# Patient Record
Sex: Female | Born: 1975 | Race: White | Hispanic: No | Marital: Single | Smoking: Never smoker
Health system: Southern US, Community
[De-identification: ages and names within clinical notes are randomized; demographics above are authoritative.]

## PROBLEM LIST (undated history)

## (undated) DIAGNOSIS — R519 Headache, unspecified: Secondary | ICD-10-CM

## (undated) DIAGNOSIS — R51 Headache: Secondary | ICD-10-CM

## (undated) DIAGNOSIS — M1990 Unspecified osteoarthritis, unspecified site: Secondary | ICD-10-CM

## (undated) DIAGNOSIS — J189 Pneumonia, unspecified organism: Secondary | ICD-10-CM

## (undated) DIAGNOSIS — Z8742 Personal history of other diseases of the female genital tract: Secondary | ICD-10-CM

## (undated) HISTORY — DX: Personal history of other diseases of the female genital tract: Z87.42

## (undated) HISTORY — PX: ABDOMINAL HYSTERECTOMY: SHX81

## (undated) HISTORY — PX: CHOLECYSTECTOMY: SHX55

---

## 2000-12-02 ENCOUNTER — Other Ambulatory Visit: Admission: RE | Admit: 2000-12-02 | Discharge: 2000-12-02 | Payer: Self-pay | Admitting: *Deleted

## 2001-02-20 ENCOUNTER — Encounter: Payer: Self-pay | Admitting: *Deleted

## 2001-02-20 ENCOUNTER — Ambulatory Visit (HOSPITAL_COMMUNITY): Admission: RE | Admit: 2001-02-20 | Discharge: 2001-02-20 | Payer: Self-pay | Admitting: *Deleted

## 2001-05-04 ENCOUNTER — Ambulatory Visit (HOSPITAL_COMMUNITY): Admission: RE | Admit: 2001-05-04 | Discharge: 2001-05-04 | Payer: Self-pay | Admitting: *Deleted

## 2001-05-04 ENCOUNTER — Encounter: Payer: Self-pay | Admitting: *Deleted

## 2001-06-27 ENCOUNTER — Inpatient Hospital Stay (HOSPITAL_COMMUNITY): Admission: RE | Admit: 2001-06-27 | Discharge: 2001-06-29 | Payer: Self-pay | Admitting: *Deleted

## 2001-11-06 ENCOUNTER — Inpatient Hospital Stay (HOSPITAL_COMMUNITY): Admission: AD | Admit: 2001-11-06 | Discharge: 2001-11-07 | Payer: Self-pay | Admitting: *Deleted

## 2001-12-21 ENCOUNTER — Encounter: Payer: Self-pay | Admitting: *Deleted

## 2001-12-21 ENCOUNTER — Ambulatory Visit (HOSPITAL_COMMUNITY): Admission: RE | Admit: 2001-12-21 | Discharge: 2001-12-21 | Payer: Self-pay | Admitting: *Deleted

## 2001-12-28 ENCOUNTER — Inpatient Hospital Stay (HOSPITAL_COMMUNITY): Admission: AD | Admit: 2001-12-28 | Discharge: 2001-12-30 | Payer: Self-pay | Admitting: *Deleted

## 2004-05-16 ENCOUNTER — Ambulatory Visit (HOSPITAL_COMMUNITY): Admission: RE | Admit: 2004-05-16 | Discharge: 2004-05-16 | Payer: Self-pay | Admitting: *Deleted

## 2005-02-11 ENCOUNTER — Emergency Department (HOSPITAL_COMMUNITY): Admission: EM | Admit: 2005-02-11 | Discharge: 2005-02-11 | Payer: Self-pay | Admitting: Emergency Medicine

## 2005-12-27 ENCOUNTER — Other Ambulatory Visit: Admission: RE | Admit: 2005-12-27 | Discharge: 2005-12-27 | Payer: Self-pay | Admitting: Obstetrics & Gynecology

## 2006-07-01 DIAGNOSIS — Z8742 Personal history of other diseases of the female genital tract: Secondary | ICD-10-CM

## 2006-07-01 HISTORY — DX: Personal history of other diseases of the female genital tract: Z87.42

## 2006-08-26 ENCOUNTER — Ambulatory Visit: Payer: Self-pay | Admitting: Gastroenterology

## 2006-10-13 ENCOUNTER — Ambulatory Visit (HOSPITAL_COMMUNITY): Admission: RE | Admit: 2006-10-13 | Discharge: 2006-10-13 | Payer: Self-pay | Admitting: Family Medicine

## 2006-11-05 ENCOUNTER — Emergency Department (HOSPITAL_COMMUNITY): Admission: EM | Admit: 2006-11-05 | Discharge: 2006-11-05 | Payer: Self-pay | Admitting: Emergency Medicine

## 2006-12-29 ENCOUNTER — Other Ambulatory Visit: Admission: RE | Admit: 2006-12-29 | Discharge: 2006-12-29 | Payer: Self-pay | Admitting: Obstetrics and Gynecology

## 2006-12-30 HISTORY — PX: COLPOSCOPY: SHX161

## 2007-07-30 ENCOUNTER — Other Ambulatory Visit: Admission: RE | Admit: 2007-07-30 | Discharge: 2007-07-30 | Payer: Self-pay | Admitting: Obstetrics and Gynecology

## 2007-09-23 ENCOUNTER — Emergency Department (HOSPITAL_COMMUNITY): Admission: EM | Admit: 2007-09-23 | Discharge: 2007-09-23 | Payer: Self-pay | Admitting: Emergency Medicine

## 2007-09-27 ENCOUNTER — Inpatient Hospital Stay (HOSPITAL_COMMUNITY): Admission: EM | Admit: 2007-09-27 | Discharge: 2007-09-30 | Payer: Self-pay | Admitting: Emergency Medicine

## 2007-09-29 ENCOUNTER — Ambulatory Visit: Payer: Self-pay | Admitting: Internal Medicine

## 2007-10-19 ENCOUNTER — Ambulatory Visit: Payer: Self-pay | Admitting: Gastroenterology

## 2007-10-19 ENCOUNTER — Encounter: Payer: Self-pay | Admitting: Gastroenterology

## 2007-10-19 ENCOUNTER — Ambulatory Visit (HOSPITAL_COMMUNITY): Admission: RE | Admit: 2007-10-19 | Discharge: 2007-10-19 | Payer: Self-pay | Admitting: Gastroenterology

## 2007-10-19 HISTORY — PX: OTHER SURGICAL HISTORY: SHX169

## 2008-04-04 ENCOUNTER — Other Ambulatory Visit: Admission: RE | Admit: 2008-04-04 | Discharge: 2008-04-04 | Payer: Self-pay | Admitting: Obstetrics and Gynecology

## 2008-10-27 ENCOUNTER — Other Ambulatory Visit: Admission: RE | Admit: 2008-10-27 | Discharge: 2008-10-27 | Payer: Self-pay | Admitting: Obstetrics & Gynecology

## 2009-02-23 ENCOUNTER — Ambulatory Visit (HOSPITAL_COMMUNITY): Admission: RE | Admit: 2009-02-23 | Discharge: 2009-02-23 | Payer: Self-pay | Admitting: Obstetrics & Gynecology

## 2009-05-22 ENCOUNTER — Inpatient Hospital Stay (HOSPITAL_COMMUNITY): Admission: AD | Admit: 2009-05-22 | Discharge: 2009-05-28 | Payer: Self-pay | Admitting: Obstetrics and Gynecology

## 2009-05-23 ENCOUNTER — Encounter: Payer: Self-pay | Admitting: Obstetrics and Gynecology

## 2009-08-16 ENCOUNTER — Inpatient Hospital Stay (HOSPITAL_COMMUNITY): Admission: AD | Admit: 2009-08-16 | Discharge: 2009-08-16 | Payer: Self-pay | Admitting: Obstetrics and Gynecology

## 2009-08-19 ENCOUNTER — Inpatient Hospital Stay (HOSPITAL_COMMUNITY): Admission: AD | Admit: 2009-08-19 | Discharge: 2009-08-20 | Payer: Self-pay | Admitting: Obstetrics & Gynecology

## 2009-08-29 ENCOUNTER — Inpatient Hospital Stay (HOSPITAL_COMMUNITY): Admission: RE | Admit: 2009-08-29 | Discharge: 2009-08-31 | Payer: Self-pay | Admitting: Obstetrics & Gynecology

## 2009-09-29 HISTORY — PX: INTRAUTERINE DEVICE INSERTION: SHX323

## 2010-07-22 ENCOUNTER — Encounter: Payer: Self-pay | Admitting: Internal Medicine

## 2010-09-24 LAB — CBC
HCT: 30.6 % — ABNORMAL LOW (ref 36.0–46.0)
Hemoglobin: 10.4 g/dL — ABNORMAL LOW (ref 12.0–15.0)
MCHC: 33.7 g/dL (ref 30.0–36.0)
MCHC: 34 g/dL (ref 30.0–36.0)
MCV: 85.3 fL (ref 78.0–100.0)
MCV: 86.7 fL (ref 78.0–100.0)
Platelets: 277 10*3/uL (ref 150–400)
Platelets: 288 10*3/uL (ref 150–400)
RDW: 13.1 % (ref 11.5–15.5)
RDW: 13.2 % (ref 11.5–15.5)

## 2010-10-03 LAB — URINALYSIS, ROUTINE W REFLEX MICROSCOPIC
Ketones, ur: NEGATIVE mg/dL
Protein, ur: NEGATIVE mg/dL
Urobilinogen, UA: 0.2 mg/dL (ref 0.0–1.0)

## 2010-10-03 LAB — URINE CULTURE: Colony Count: 2000

## 2010-10-03 LAB — CBC
Hemoglobin: 9.9 g/dL — ABNORMAL LOW (ref 12.0–15.0)
MCHC: 34 g/dL (ref 30.0–36.0)
MCV: 92.6 fL (ref 78.0–100.0)
RBC: 3.15 MIL/uL — ABNORMAL LOW (ref 3.87–5.11)
WBC: 8.1 10*3/uL (ref 4.0–10.5)

## 2010-10-03 LAB — FETAL FIBRONECTIN: Fetal Fibronectin: NEGATIVE

## 2010-10-03 LAB — RPR: RPR Ser Ql: NONREACTIVE

## 2010-10-06 LAB — TYPE AND SCREEN: Antibody Screen: NEGATIVE

## 2010-10-06 LAB — CBC
HCT: 36.4 % (ref 36.0–46.0)
Hemoglobin: 12.4 g/dL (ref 12.0–15.0)
MCHC: 34.2 g/dL (ref 30.0–36.0)
RBC: 3.92 MIL/uL (ref 3.87–5.11)
RDW: 13.4 % (ref 11.5–15.5)
WBC: 9.1 10*3/uL (ref 4.0–10.5)

## 2010-10-06 LAB — ABO/RH: ABO/RH(D): O POS

## 2010-11-13 NOTE — Consult Note (Signed)
Amy Lee, Amy Lee               ACCOUNT NO.:  [id number]   MEDICAL RECORD NO.:  [id number]          PATIENT TYPE:  INP   LOCATION:  A223                          FACILITY:  [facility]   PHYSICIAN:  R. Roetta Sessions, M.D. DATE OF BIRTH:  03-18-1976   DATE OF CONSULTATION:  09/29/2007  DATE OF DISCHARGE:                                 CONSULTATION   PRIMARY CARE PHYSICIAN:  Social research officer, government.   REASON FOR CONSULTATION:  Diarrhea, colitis.   HISTORY OF PRESENT ILLNESS:  The patient is a 35 year old Caucasian  female who presented to the hospital after a syncopal episode.  She has  had diarrhea for about one week.  She actually was seen in the emergency  department last Wednesday for the same.  She was given Lomotil,  Phenergan and Reglan, without any results.  She states she has had the  diarrhea for over one week.  She is having 5 to 10 non-bloody stools a  day.  She complains of abdominal cramping, but really no abdominal pain.  She has had nausea but no vomiting.  Her symptoms all began about one  week ago.  She says that she was about on her fourth or fifth day of  Cipro which she was given for a urinary tract infection.  She has  actually been watching her diet with a low-fat, low-carb diet and has  lost 20 pounds over the last two to three months.  She was working on  the job out of town and did not bring her lunch.  She went to [employer] and  had a kid's hamburger.  Her stomach began to swell.  She had a lot of  grumbling within one hour of eating.  She went to bed that evening and  woke up in the middle of the night with profuse diarrhea.  She has also  developed back pain, particularly on the low right side.  It was for the  back pain, that she presented to the emergency department last  Wednesday.  She continued to have diarrhea daily.  She did not eat very  much because of the symptoms.  She finally decided to come back to the  emergency department after she had a  syncopal episode after having a  bowel movement early  Sunday morning.   She had a CT of the abdomen and pelvis which revealed descending colonic  wall thickening and peri-colonic stranding with free pelvic fluid.  She  also had a right ovarian cyst and some hypodensity of the dome of the  liver and the right hepatic lobe, which were too small to characterize.  Her hemoglobin was 12.6 on admission and most likely due to dilutional  effect, is down to 11.3.  Her white count is 3800.  Albumin 2.6.  TSH  normal.  Sedimentation rate 2.  Stool lactoferrin was negative.  C.  difficile x1 was negative.  Stool cultures pending.   She had a decrease in her stools right after admission, but yesterday  apparently had up to 5 stools again.  For this reason, we have been  asked to see the patient.   She has been seen by Dr. Kassie Mends back in February 2008.  She was  seen for abdominal bloating and alternating constipation and diarrhea.  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  She failed to follow up in May 2008.   HOME MEDICATIONS:  1. Lomotil two q.6h., which was given to her through the emergency      department.  2. Reglan through the emergency department.  3. Phenergan through the emergency department.  4. She took Cipro four to five days last week.   ALLERGIES:  STADOL, CECLOR AND FIORICET.   PAST MEDICAL/SURGICAL HISTORY:  1. Migraine headaches.  2. Recent urinary tract infection.  3. IBS.  4. Status post cholecystectomy.  5. D&C.   FAMILY HISTORY:  Grandmother had colon cancer and ovarian cancer.  No  family history of IBD.   SOCIAL HISTORY:  She works as a Advice worker for [employer].  She  lives with her boyfriend.  She has three children.  She denies tobacco  use.  She occasionally drinks alcohol socially.   REVIEW OF SYSTEMS:  See the HPI for GI.  CONSTITUTIONAL:  A 20-pound  weight loss, intentional,  with change in diet.  She is also limiting her  caffeine consumption, as recommended by Dr. Cira Servant, per her report.  CARDIOPULMONARY:  No chest pain, shortness of breath or palpitations.  GENITOURINARY:  She has an IUD in place.   PHYSICAL EXAMINATION:  VITAL SIGNS:  Temperature 98.6 degrees, pulse 63,  respirations 20, blood pressure 94/64, O2 saturation 97% on room air.  GENERAL:  A pleasant young Caucasian female, in no acute distress.  SKIN:  Warm and dry.  No jaundice.  HEENT:  Sclerae anicteric.  Oropharyngeal mucosa moist and pink.  No  lesions, erythema or exudates.  NECK:  No lymphadenopathy.  CHEST:  Lungs are clear to auscultation.  HEART:  A regular rate and rhythm.  Normal S1 and S2.  No murmurs, rubs  or gallops.  ABDOMEN:  Positive bowel sounds.  Abdomen is soft, nondistended,  nontender.  No organomegaly or masses.  No rebound or guarding.  No  abdominal bruits or herniae.  EXTREMITIES:  Lower extremities:  No edema.   LABORATORY DATA:  As mentioned above.  In addition, sodium 139,  potassium 3.6, BUN 1, creatinine 0.72, glucose 88.  Platelets 170,000.   IMPRESSION:  The patient is a 35 year old Caucasian female with acute  onset diarrhea, of now seven days' duration.  A CT shows descending  colitis.  Stool cultures are pending, although her Clostridium difficile  and lactoferrin were negative x1.  She is empirically on Flagyl and  Cipro is to be added today per orders.   DIFFERENTIAL DIAGNOSES:  Includes food-borne illness, upper tract, viral  gastroenteritis, Clostridium difficile colitis (less than likely given  the failure with Flagyl and negative Clostridium difficile, but not  excluded).  Irritable bowel disease.   RECOMMENDATIONS:  1. Continue supportive measures along with antibiotic therapy.  2. She may need a colonoscopy.  Will discuss further with Dr.      __________ and provide  further recommendations.   I would like to thank Dr. Osvaldo Shipper for  allowing [place] to take part in  the care of this patient.      Tana Coast, P.AJonathon Bellows, M.D.  Electronically Signed  LL/MEDQ  D:  09/29/2007  T:  09/29/2007  Job:  [id number]   cc:   [facility]   Osvaldo Shipper, MD

## 2010-11-13 NOTE — H&P (Signed)
Amy Lee, Amy Lee               ACCOUNT NO.:  [id number]   MEDICAL RECORD NO.:  [id number]          PATIENT TYPE:  INP   LOCATION:  A223                          FACILITY:  [facility]   PHYSICIAN:  Osvaldo Shipper, MD     DATE OF BIRTH:  11-Jun-1976   DATE OF ADMISSION:  09/27/2007  DATE OF DISCHARGE:  LH                              HISTORY & PHYSICAL   PRIMARY CARE PHYSICIAN:  Social research officer, government.   ADMISSION DIAGNOSES:  1. Acute diarrhea.  2. Back pain.  3. History of migraine headaches.   CHIEF COMPLAINT:  Diarrhea for the past 5 days.   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  The patient went to her primary doctor  who was a PA at the [facility].  The patient was told that she  may have had a UTI and was prescribed Cipro for 5 days.  So this past  Tuesday the patient had a burger from [employer] and then she started  noticing bloating and cramps in her stomach.  This was followed by  nausea but no emesis and then followed by diarrhea.  She was having 9-10  episodes of loose watery stools with no blood every day.  She went to  her doctor's office and was prescribed some antiemetics.  She went to  the ER on March 25 and was again prescribed some symptomatic treatment  with Dilaudid, Reglan and Lomotil.  The diarrhea persisted but she felt  like it was getting better yesterday.  Then sometime overnight the  patient got up to use the bathroom and then passed out.  She had some  what she describes as heart fluttering and some pressure like sensation  over her chest when she passed out which since has resolved.  She  denies, however, any lightheadedness or any symptoms suggestive of  orthostatic hypotension prior to this episode of syncope.  When she had  the  syncope she got concerned and came back to the emergency department.  There she was found to have a low blood pressure of 77 over 40s.  The  patient was given IV fluids and we were called for further evaluation.  The patient also had a fever of 100.1 during the past couple of days.  She denied any acute pain in the abdomen, just cramps.  The lower back  pain also started a few days ago, mostly in the right side.  She fell  down and she had a syncope but she fell on her stomach and denies any  injury to the back.   MEDICATIONS AT HOME:  No scheduled medications.  She was on Lomotil,  Reglan, Phenergan on an as-needed basis for the past few days.   ALLERGIES:  Include STADOL.  She is also allergic to [employer] and  FIORICET.   PAST MEDICAL HISTORY:  Positive for migraine headaches,  irritable bowel  syndrome, cholecystectomy, preterm labor, D and C.   SOCIAL HISTORY:  Lives in [place] with boyfriend.  Works as a  Advice worker.  No smoking use.  Social alcohol use.   FAMILY HISTORY:  Positive for ovarian and colon cancer in grandmother,  history of hypertension and dyslipidemia.   REVIEW OF SYSTEMS:  GENERAL:  Positive for weakness, malaise.  HEENT:  Unremarkable.  CARDIOVASCULAR:  Unremarkable.  RESPIRATORY:  As in HPI.  GI:  As in HPI.  GU:  Unremarkable.  NEUROLOGIC:  Unremarkable.  PSYCHIATRIC:  Unremarkable.  Other systems unremarkable.   PHYSICAL EXAMINATION:  VITAL SIGNS:  Since she came into the ED she had  a temperature of 97.2, blood pressure was 78/57, heart rate 84,  respiratory rate 18, saturation 98% on room air.  Blood pressure 106/68  subsequently, 89/49 at the last reading available to me.  GENERAL:  A slightly overweight obese female in no distress.  She denies  any lightheadedness or dizziness at this time.  Denies any chest pain.  HEENT:  There is no pallor, no icterus.  Oral mucous membranes slightly  dry.  No oral lesions are noted.  NECK:  Soft and  supple.  No thyromegaly is appreciated.  LUNGS:  Clear to auscultation bilaterally.  No wheezing, rales or  rhonchi present.  CARDIOVASCULAR:  S1, S2 normal, regular.  No murmurs appreciated.  ABDOMEN:  Soft, slight vague tenderness in the lower quadrants.  Bowel  sounds are normal.  No mass or organomegaly is appreciated.  BACK:  Examination of the back reveals tenderness in the left lower back  area, almost over the left sacroiliac joint.  She was able to flex and  extend the back but with some limitation.  EXTREMITIES:  Show no edema.  No erythema.  Tenderness is present.  Peripheral pulses are palpable.  NEUROLOGIC:  The patient is alert,  oriented x3.  No focal neurologic deficits are present.   LAB DATA:  White count is 3.8, her hemoglobin is 12.6, MCV 86, platelet  count 194.  Her BMET is unremarkable.  UA showed small bilirubin, large  blood, trace protein, negative nitrite, negative leukocytes, 11-20 RBCs,  many bacteria, 0-2 WBCs.  The patient did not have any CVA tenderness.  No imaging studies have been done.   ASSESSMENT:  This is a 35 year old Caucasian female who presents with a  5 day history of acute diarrhea with some nausea and lower back pain.  I  think there are a couple of possibilities.  One is this could be  gastroenteritis because of something she ate out.  This could be related  to the Cipro that she was taking.  This could also be C. difficile  although that is somewhat less likely.  Her back pain could be reactive  arthritis because of the diarrhea.  She could have hurt herself when she  fell although the pain did start before she fell down last night.  Syncope most likely because of hypotension and dehydration.  I do not  have any reason to suspect anything else.  She did have some nonspecific  chest symptoms which have resolved as of now.   PLAN:  1. Acute diarrhea.  Stool studies will be sent.  Symptomatic treatment      in the form of IV fluids will be  given.  I will hold off on using      any Imodium as of now.  We will empirically start her  on Flagyl and      await stool studies.  2. Lower back pain.  Will start with x-rays of the lower back at the      SI joint to make sure there is no obvious traumatic injury over      there.  3. Syncope likely because of dehydration and orthostatic hypotension.      IV fluids will be given aggressively.  If      her chest symptoms persist and reappear we may have to consider      other etiologies like pulmonary embolism, etc.  TSH will be checked      as well.  LFTs will be checked.  Labs will be repeated tomorrow      morning.  I anticipate patient improving very quickly and going      home in the next couple of days.      Osvaldo Shipper, MD  Electronically Signed     GK/MEDQ  D:  09/27/2007  T:  09/27/2007  Job:  [id number]

## 2010-11-13 NOTE — Op Note (Signed)
NAMESHARILYNN, CASSITY               ACCOUNT NO.:  [id number]   MEDICAL RECORD NO.:  [id number]          PATIENT TYPE:  AMB   LOCATION:  DAY                           FACILITY:  [facility]   PHYSICIAN:  Kassie Mends, M.D.      DATE OF BIRTH:  May 03, 1976   DATE OF PROCEDURE:  10/19/2007  DATE OF DISCHARGE:                               OPERATIVE REPORT   REFERRING PHYSICIAN:  Melony Overly, PA   PROCEDURE:  Ileocolonoscopy with cold forceps biopsy.   INDICATIONS FOR EXAM:  Amy Lee is a 35 year old female who presented  with sudden worsening of abdominal pain and diarrhea associated with  colitis on CT scan.  She was treated with Flagyl, which caused dizziness  and she stopped it.  She chronically has intermittent diarrhea.   FINDINGS:  1. Normal terminal ileum approximately 10-20 cm visualized.  2. Normal colon without evidence of polyps, masses, inflammatory      changes, diverticula, or AVMs.  Biopsies obtained via cold forceps      to evaluate for microscopic colitis as an etiology for intermittent      diarrhea.  3. Large internal hemorrhoids.  Otherwise, normal retroflex view of      the rectum.   DIAGNOSIS:  Colitis, resolved.   RECOMMENDATIONS:  1. Will call Amy Lee with the results of her biopsies.  The biopsies      showed no evidence of microscopic colitis and her intermittent      diarrhea persists, then could consider an EGD with duodenal      biopsies in the future.  2. No aspirin or NSAIDs or anticoagulation for 5 days.  3. She should follow a high-fiber diet.  She was given a handout on      high-fiber diet and hemorrhoids.   MEDICATIONS:  1. Demerol 100 mg IV.  2. Versed 4 mg IV.  3. Phenergan 25 mg IV.   PROCEDURE TECHNIQUE:  Physical exam was performed.  Informed consent was  obtained from the patient after explaining the benefits, risks, and  alternatives to the procedure.  The patient was connected to the monitor  and placed in the left lateral position.   Continuous oxygen was provided  by nasal cannula.  IV medicine administered through an indwelling  cannula.  After administration of sedation and rectal exam, the  patient's rectum was intubated and the scope was advanced under direct  visualization to the distal terminal ileum.  The scope was removed  slowly  by carefully examining the color, texture, anatomy, and integrity of the  mucosa on the way out.  The patient was recovered in endoscopy and  discharged home in satisfactory condition.   PATH:  nl colon. Melanosis coli.      Kassie Mends, M.D.  Electronically Signed     SM/MEDQ  D:  10/19/2007  T:  10/19/2007  Job:  [id number]   cc:   Melony Overly, PA

## 2010-11-13 NOTE — Group Therapy Note (Signed)
Amy Lee, Amy Lee               ACCOUNT NO.:  [id number]   MEDICAL RECORD NO.:  [id number]          PATIENT TYPE:  INP   LOCATION:  A223                          FACILITY:  [facility]   PHYSICIAN:  Osvaldo Shipper, MD     DATE OF BIRTH:  21-Jul-1975   DATE OF PROCEDURE:  09/29/2007  DATE OF DISCHARGE:                                 PROGRESS NOTE   SUBJECTIVE:  The patient had recurrence of diarrhea yesterday.  She had  five episodes of watery stool.  She has not had any this morning.  The  diarrhea started after she started eating yesterday.  Her back pain is  better.  She continues to have the left upper chest pain, especially  only on palpation.  She also admits to now having some lower abdominal  pain which also started yesterday.  She had an episode of dizziness  yesterday which has resolved.   OBJECTIVE:  VITAL SIGNS:  She is afebrile, heart rate in the 60s to 70s,  respiratory rate 18, blood pressure 94/64, saturation 97% on room air.  LUNGS:  Clear to auscultation bilaterally.  No wheezing, rales or  rhonchi.  CHEST:  Left upper chest wall is tender to palpation.  CARDIAC EXAM:  S1 and S2, normal, regular.  ABDOMEN:  Soft and nontender.  There is slight tenderness in the lower  part of the abdomen in the middle, but otherwise fairly benign.  Bowel  sounds are present and active.  No edema is noted.   LABORATORY DATA:  White count of 3.8, hemoglobin is 7.3, MCV is 86,  platelet count is 170.  Her BMET today is unremarkable.  Her TSH was  1.91.  Clostridium difficile was negative.  Stool cultures are all  pending at this point.   IMAGING STUDIES:  She had a CAT scan of the abdomen and pelvis done  yesterday which showed trace ascites, trace right pleural effusion,  evidence for colitis involving the descending colon was noted.  A right  ovarian cyst was also seen.   ASSESSMENT/PLAN:  1. Acute to diarrhea.  With CT findings this is most likely secondary      to colitis.  The  etiology for the colitis is unclear.  It could be      inflammatory bowel disease, it could be infectious.  We have her on      Flagyl for now.  We will initiate Cipro, as well.  We will at this      point consult GI, as her symptoms have recurred.  The patient      reports that she has seen Dr. Cira Servant in the past, as they were      suspecting celiac sprue.  I wonder if this patient has evidence for      inflammatory bowel disease.  2. Back pain; could be reactive arthritis.  X-rays have not shown any      acute or traumatic findings.  A CT scan bone windows also did not      show any evidence for acute process, and this pain has improved.  3. Left upper chest pain.  She fell at home, and she had a syncopal      episode.  She could have sustained some trauma, and since the pain      has not improved, I will go ahead and get x-ray of the ribs in that      area.  4. History of syncope because of dehydration has improved.  5. Right ovarian cyst.  We will schedule an ultrasound of the pelvis      in 6 weeks to further evaluate this.  I do not      think this cyst is causing any of her symptoms at this point.  6. Anemia.  Hemoglobin and hematocrit are stable.  The drop in the      hemoglobin was likely dilutional.  7. Mild leukopenia.  Etiology is not very clear.  If this persists she      may need outpatient hematological evaluation.      Osvaldo Shipper, MD  Electronically Signed     GK/MEDQ  D:  09/29/2007  T:  09/29/2007  Job:  [id number]   cc:   Kassie Mends, M.D.  [address]  [place] , [place] [address]

## 2010-11-13 NOTE — Op Note (Signed)
Amy Lee, Amy Lee                 ACCOUNT NO.:  [id number]   MEDICAL RECORD NO.:  [id number]          PATIENT TYPE:  AMB   LOCATION:  SDC                           FACILITY:  [facility]   PHYSICIAN:  Genia Del, M.D.DATE OF BIRTH:  10-20-1975   DATE OF PROCEDURE:  02/23/2009  DATE OF DISCHARGE:  02/23/2009                               OPERATIVE REPORT   PREOPERATIVE DIAGNOSES:  A 12 weeks' gestation, history of incompetent  cervix.   POSTOPERATIVE DIAGNOSES:  A 12 weeks' gestation, history of incompetent  cervix.   PROCEDURE:  Modified McDonald cervical cerclage.   SURGEON:  Genia Del, MD   ASSISTANT:  No assistant.   PROCEDURE:  Under spinal anesthesia, the patient is in lithotomy  position.  She is prepped with Betadine on the suprapubic vulvar and  vaginal areas and draped as usual.  The vaginal exam reveals a closed  firm long cervix.  Note, that the fetal heart tone was taken before the  procedure and was at 164 per minute.  We insert the weighted speculum in  the vagina.  The anterior lip of the cervix was grasped with a  fenestrated clamp.  Mersilene band was used for the cerclage.  We start  at 12 with a large needle.  We come out at 3 than at 6, 9, and back at  12.  We attach the cerclage at 12 and insert a Prolene before making the  knots with the Mersilene band to ease eventual removal.  All instruments  are removed.  The cervix is tighter.  Post cerclage minimal.  Bleeding  occurred.  The patient received Flagyl 500 mg IV before the procedure.  No complications occurred and the patient was brought to recovery room  in good stable status.      Genia Del, M.D.  Electronically Signed     ML/MEDQ  D:  02/23/2009  T:  02/24/2009  Job:  [id number]

## 2010-11-13 NOTE — Discharge Summary (Signed)
Amy Lee, GARLAND               ACCOUNT NO.:  [id number]   MEDICAL RECORD NO.:  [id number]          PATIENT TYPE:  INP   LOCATION:  A223                          FACILITY:  [facility]   PHYSICIAN:  Dorris Singh, DO    DATE OF BIRTH:  09-23-1975   DATE OF ADMISSION:  09/27/2007  DATE OF DISCHARGE:  04/01/2009LH                               DISCHARGE SUMMARY   ADMISSION DIAGNOSES:  1. Acute diarrhea.  2. Back pain.  3. History of migraine headaches   DISCHARGE DIAGNOSES:  1. Colitis.  2. Diarrhea.  3. Back pain.  4. History of migraine headaches.   PRIMARY CARE PHYSICIAN:  Amy Lee, M.D.   Her H&P was done by Dr. Rito Ehrlich.  Please refer to that.   TESTS:  She had an x-ray on September 27, 2007, of her sacroiliac joints,  which were normal.  Her lumbar spine on the 29th, which showed  transitional anatomy.  The lowest vertebra articulates with the sacrum  on the left.  I think there are mild facet degenerative changes on the  right at the lowest two joints.  She also had a CT of the abdomen with  contrast on March 30,which demonstrated no acute intra-abdominal  pathology, and a CT of pelvis showed descending colonic wall thickening  and pericolonic strand with free pelvic fluid. findings most likely due  to colitis, possibly inflammatory bowel and ulcerative colitis, no  evidence of perforation.  Right ovarian cyst, amenable to follow-up  ultrasound in 6-8 weeks during the week following the patient's normal  menstrual period.  She also had unilateral ribs on March 31, which  demonstrated no acute abnormality.   The patient was admitted to the service of [facility] for diarrhea and  stool cultures were sent.  She had to a C. difficile which was negative  as well as regular stool cultures which were negative as well.  Her ESR  was negative.  GI was consulted on her case and she was started  empirically on Flagyl.  Also for lower back pain, a series of x-rays was  done as  mentioned before.  Due to a syncopal episode that she had that  was probably due to dehydration, she was started on aggressive IV  fluids.  The patient continued to improve while she was here.  She  refused to take the Cipro.  She felt that this is contributing to her  diarrhea.  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  She was seen by GI, who recommended just the symptomatic treatment and  that they can discuss a colonoscopy outpatient.  The patient's  hemoglobin dipped but it seemed to be within normal limits.  This is  something that she can have worked up by her primary care physician.  Also this could occur because of dilutional effect.  She was also found  to have a right ovarian cyst, so she will be recommended to have an  ultrasound of her pelvis, which has been recommended  on her discharge as  well, and she is recommended to continue with 7-10-day dose of Flagyl.  I discussed this with the patient.  She does not believe she that this  is due to C. difficile, but I have agreed with her for her to continue  for at least a total of 7 days on Flagyl and she has stated  understanding and is in agreement with this.  She is okay to go home.  It is recommended that she follows up with Dr. Geanie Logan office in 2  days and that she also does a bland diet and increases her activity  slowly.  She has an ultrasound of the pelvis on May 12 at 8:30.  She is  recommended to have a full bladder.  It is also recommend to follow up  with Dr. Jena Gauss within 1-2 weeks and especially if symptoms worsen.   She will be sent home on her following medications, which include:  1. Atropine 2 __________ a day every 6 hours.  2. Phenergan 12.5 mg one every 6 hours p.r.n. nausea.  3. Flagyl 500 mg one three times a day x5 more days.   DISPOSITION:  To home.   CONDITION:  Stable.   The patient has stated understanding  with the current follow-up.      Dorris Singh, DO  Electronically Signed     CB/MEDQ  D:  09/30/2007  T:  09/30/2007  Job:  [id number]

## 2010-11-16 NOTE — H&P (Signed)
[facility]  Patient:    Amy Lee, Amy Lee Visit Number: [id number] MRN: [id number]          Service Type: MED Location: 4A A414 01 Attending Physician:  Jeri Cos. Dictated by:   Langley Gauss, M.D. Admit Date:  12/28/2001 Discharge Date: 12/30/2001                           History and Physical  HISTORY OF PRESENT ILLNESS:  The patient is a 35 year old gravida 5 para 3 at 19-1/[redacted] weeks gestation with the onset of uterine contractions at work on the day of admission.  She states that post 1400 hours the contractions increased in their frequency and intensity thus she presented to labor and delivery complaining of pelvic pressure and the sensation of what felt like a full tampon falling out her vagina.  The patient also describes increased mucousy discharge.  She denies any rupture of membranes or any vaginal bleeding.  OBSTETRICAL HISTORY:  Prior OB history is pertinent for two prior vaginal deliveries without complications.  Prenatal course this pregnancy has been uncomplicated to date.  She had been admitted at one time only at [redacted] weeks gestation for hyperemesis gravidarum. She is also noted to have reflux disease for which she does very well taking p.o. Zantac.  Other pregnancies were without complications.  She did deliver slightly premature at [redacted] weeks gestation.  PHYSICAL EXAMINATION:  GENERAL:  A healthy white female in no acute distress.  VITAL SIGNS:  Temperature 97.5, weight is 180 pounds, blood pressure 122/67, pulse of 79, respiratory rate of 20.  HEENT:  Negative.  No adenopathy.  Neck is supple.  Thyroid is nonpalpable. Mucous membranes are moist.  LUNGS:  Clear.  CARDIOVASCULAR:  Regular rate and rhythm.  ABDOMEN:  Soft and nontender.  Gravid uterus identified at the level of the umbilicus, it is nontender and soft.  No surgical scars are identified. External fetal monitor had revealed evidence of very mild though  rhythmic uterine activity each about 15-30 seconds in duration and about 5 minutes apart.  Thus, pelvic examination is performed.  The external genitalia noted to be normal in appearance.  On bimanual examination, utilizing a sterile glove, there is noted to be bulging membranes with palpable fetal parts present within the vaginal vault.  I was unable to palpate any cervix as I did not pursue that aggressive of an examination.  A sterile speculum examination was then carefully performed which does confirm intact membranes present within the vaginal vault and presumably hourglassing through a partially dilated cervix.  ASSESSMENT AND PLAN:  A 19-1/2 week intrauterine pregnancy with hourglassing membranes. The patients uterine activity is somewhat minimal thus, these findings would be more consistent with that of an incompetent cervix.  These findings were discussed with the patient as well as the extreme prematurity. In fact, this would constitute a second-trimester pregnancy loss.  Best hope at this time is that by placing the patient in Trendelenburg position the membranes may spontaneously reduce themselves through the cervix.  If this did occur, we could possibly consider performance of a cerclage.  Otherwise with the hourglassing membranes as they are any attempt at a cerclage would undoubtedly result in the rupture of the membranes.  The patient and her family are made fully aware of the very bleak prognosis for this pregnancy currently at 19-1/[redacted] weeks gestation.  She at this point in time is placed in a Trendelenburg position.  A limited OB ultrasound is performed on labor and delivery by Dr. Langley Gauss which does reveal a single intrauterine pregnancy with viable fetal cardiac activity identified, fetal movement is identified, gestational age is consistent with [redacted] weeks gestation.  There is normal amniotic fluid volume noted.  Double footling breech presentation. Currently,  there is noted to a large intact sac of fluid present within the vaginal vault.  Best guesstimate upon visualizing the cervix would be that of only 2 cm dilation with hourglassing membranes distal to this.  Thus, the plan having been obtained the patient is placed in the Trendelenburg position to be monitored on the following date. Dictated by:   Langley Gauss, M.D. Attending Physician:  Jeri Cos. DD:  01/10/02 TD:  01/12/02 Job: [id number]

## 2010-11-16 NOTE — H&P (Signed)
[facility]  Patient:    Amy Lee, Amy Lee Visit Number: [id number] MRN: [id number]          Service Type: OBS Location: 4A A428 01 Attending Physician:  Jeri Cos. Dictated by:   Langley Gauss, M.D. Admit Date:  11/05/2001 Discharge Date: 11/07/2001                           History and Physical  The patient initially presented to [facility] p.m. of Nov 05, 2001 at which time she was initially placed on an OB observation status; this was at 2200 on Nov 05, 2001.  I did not see the patient until Nov 06, 2001, at which time this was changed to admission, thus day of admission is Nov 06, 2001.  HISTORY OF PRESENT ILLNESS:  This is a 35 year old gravida 4, para 2, at 6 to [redacted] weeks gestation who presents complaining of one-week duration of decreased appetite with frequent nausea and vomiting.  The patient has had diarrhea on four occasions the day prior to discharge.  She also complains of a 4-pound weight loss over the past week.  The patient did give the history of eating fast-food chicken dinner about four days previously, after which time, 24 hours later, she began having the described GI symptoms.  She was concerned whether she had obtained food poisoning after ingesting the chicken, however, other family members had also eaten the fast-food chicken and not become ill. The patients prenatal course is otherwise uncomplicated.  PAST MEDICAL HISTORY:  She has had two prior vaginal deliveries without complications.  ALLERGIES:  No known drug allergies.  PHYSICAL EXAMINATION:  GENERAL:  Somewhat lethargic white female appearing pale.  VITAL SIGNS:  Blood pressure is 90/46, 101/64; pulse of 74; respiratory rate is 20; temperature 97.9.  HEENT:  Mucous membranes slightly dry.  LUNGS:  Clear.  CARDIOVASCULAR:  Regular rate and rhythm.  ABDOMEN:  Soft and nontender.  Gravid uterus identified consistent with 12-week gestation.  Fetal  heart tones are documented.  LABORATORY AND ACCESSORY DATA:  Laboratory studies obtained upon admission: Hemoglobin 11.4, hematocrit 32.3, white blood count of 9.8.  Liver function tests within normal limits.  Urinalysis obtained, however, this was several hours after admission, IV fluids had been started, thus unreliable results. At this point in time, after receiving IV fluids, the patient was noted to be negative for ketones.  ASSESSMENT:  Nov 06, 2001, 12-week intrauterine pregnancy with nausea and vomiting, dehydration and weight loss with possible food poisoning.  The patient was admitted for administration of intravenous fluids.  She also received intramuscular Phenergan for relief of the nausea, received p.o. Lomotil for relief of the diarrhea.  The patient likewise was treated empirically with ampicillin 2 g intravenously, followed b 1 g intravenously q.4h. to cover for any possible pathogens responsible for the food poisoning. No other changes in the patients treatment made on Nov 06, 2001. Dictated by:   Langley Gauss, M.D. Attending Physician:  Jeri Cos. DD:  11/26/01 TD:  11/27/01 Job: [id number]

## 2010-11-16 NOTE — Op Note (Signed)
[facility]  Patient:    AZHIA, SIEFKEN Visit Number: [id number] MRN: [id number]          Service Type: OBS Location: 4A A419 01 Attending Physician:  Jeri Cos. Dictated by:   Langley Gauss, M.D. Proc. Date: 06/27/01 Admit Date:  06/27/2001   CC:         Lilyan Punt, M.D.  Vivia Ewing, D.O.   Operative Report  DELIVERY NOTE  DIAGNOSIS:  Thirty-six-week intrauterine pregnancy with preterm labor.  DELIVERY PERFORMED:  Spontaneous assisted vaginal delivery of 5-pound 7.5-ounce female infant delivered over intact perineum, delivered by Dr. Langley Gauss.  OBSTETRICIAN:  Langley Gauss, M.D.  ESTIMATED BLOOD LOSS:  Less than 500 cc.  COMPLICATIONS:  None.  SPECIMENS:  Arterial cord gas and cord blood to pathology.  Placenta was examined and noted to be apparently intact with a three-vessel umbilical cord.  SUMMARY:  The patient had presented to [facility] at [redacted] weeks gestation in active preterm labor.  Significant cervical change was noted during the observation period, thus amniotomy was performed with the conclusion that premature delivery was inevitable in this patient at [redacted] weeks gestation. Likewise, the risk of utilizing any tocolytic agents was greater than the risks associated with prematurity at [redacted] weeks gestation.  After amniotomy, patient requested epidural analgesic; epidural was placed without difficulty and functioned very well throughout the remainder of the labor course. Patient did require Pitocin augmentation to improve the labor pattern secondary to inadequate frequency and intensity of uterine contractions. After initiation of the Pitocin, patient progressed normally along the labor curve to complete dilatation.  At about 8-cm dilatation, patient did complain of significant perineal and pelvic pressure, requested additional pain medication, thus, a bolus of 10 cc of 2% lidocaine was administered at that time;  thereafter, patient was managed expectantly.  She continued to have appropriate uterine contractions with cervical change to 10 cm, at which time she was noted again to experience small amounts of pelvic pressure.  The excellent epidural block did interfere with patients ability to push, thus she had to be coached and informed of when she was experiencing contractions during the second stage of labor; thereafter, however, she was able to push the infant to the perineal floor.  The perineum was aggressively massaged by myself to thin the perineum.  The infant then delivered in a direct OA position over this intact perineum.  Mouth and nares of the infant were bulb-suctioned of clear amniotic fluid.  Nuchal cord x 1 without compression was reduced at time of delivery.  Again, mouth and nares were bulb-suctioned of clear amniotic fluid.  Spontaneous rotation occurred to a left anterior shoulder position.  Gentle downward traction combined with maternal expulsive efforts resulted in delivery of the infant over the intact perineum.  The umbilical cord was then milked toward the infant.  Cord was doubly clamped and infant is taken to the nursing table for immediate assessment.  Arterial cord gas and cord blood are then obtained from the umbilical cord.  Gentle traction on the umbilical cord results in separation, which upon examination appears to be an intact three-vessel placenta.  Examination of the genital tract reveals no lacerations.  Specifically, the perineum is noted to be intact.  No periurethral or vaginal sidewall tears are noted.  Patient is thus taken out of dorsal lithotomy position and rolled to her side, at which time the epidural catheter is removed with the blue tip noted to be intact and shown  to the mother.  Dictated by:   Langley Gauss, M.D. Attending Physician:  Jeri Cos DD:  06/29/01 TD:  06/29/01 Job: [id number]

## 2010-11-16 NOTE — H&P (Signed)
[facility]  Patient:    Amy Lee, Amy Lee Visit Number: [id number] MRN: [id number]          Service Type: OBS Location: 4A A419 01 Attending Physician:  Jeri Cos. Dictated by:   Langley Gauss, M.D. Admit Date:  06/27/2001                           History and Physical  HISTORY OF PRESENT ILLNESS:  A 35 year old gravida 5, para 2, two prior vaginal deliveries who presents to [facility] at [redacted] weeks gestation complaining of onset of uterine contractions at 0430 which were strong enough to awaken her from sleep.  They continued until her arrival at [facility].  She was noted on the external fetal monitor to be contracting q.3-10m.  On initial examination by the nursing staff, 2-3 cm dilated.  The patient did not receive any tocolytics but was hydrated, receiving intravenous fluids as well as 10 mg of IV Nubain for sedation.  However, this failed result in any diminishment of the uterine contractions.  They actually increased in their frequency and intensity such that the patient requested additional pain medication.  When I examined the patient, she had made change to 5 cm dilated, 80% effaced, minus 1 station, vertex presentation.  Thus, it was determined that she is in preterm labor at [redacted] weeks gestation.  The patients prenatal course had been complicated by mild preterm labor and premature dilatation initially encountered at [redacted] weeks gestation.  At [redacted] weeks gestation, the patient was taken out of work and maintained strict bed rest to this point in time.  The patient at 33 weeks was noted to be dilated 2 cm. The patients prenatal course was complicated by first trimester bleeding which was spotting.  The patient was noted to be RH positive at that time.  PAST MEDICAL HISTORY:  She has history of asthma for which she uses a Proventil inhaler on a p.r.n. basis.  Reflux for which she takes Zantac and Prilosec.  The patient  underwent a laparoscopic cholecystectomy December 1998.  The patient was hospitalized January 2000 for pneumonia.  The patient experienced food poisoning December 1999.  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  She had no difficulty conceiving at this point in time.  PAST OBSTETRICAL HISTORY:  Two vaginal deliveries without complications, Nov 07, 1998, Oct 31, 1994.  The patient had a spontaneous AB December 1997.  GTT noted to be normal during all previous pregnancies.  PHYSICAL EXAMINATION:  GENERAL:  Healthy, alert, pleasant, white female in no acute distress.  VITAL SIGNS:  Blood pressure 125/70, pulse 80, respiratory rate 20.  HEENT:  Negative.  No adenopathy.  NECK:  Supple.  Thyroid is not palpable.  LUNGS:  Clear.  CARDIOVASCULAR:  Regular rate and rhythm.  ABDOMEN:  Soft, nontender.  Laparoscopic cholecystectomy scar is identified. Fundal height if 38 cm.  Vertex presentation by [employer].  EXTREMITIES:  Normal.  PELVIC:  Normal external genitalia.  Membranes intact.  Cervix 5 cm dilated, 80% effaced, minus station, vertex presentation palpable, and well applied to the cervix.  At this point in time fetal scalp electrode is placed with resultant rupture of membranes.  Clear amniotic fluid is noted.  The patient requesting pain medication at this time with a reassuring fetal heart rate and continuing uterine contractions.  Epidural catheter will be placed. Dictated by:   Langley Gauss, M.D.  Attending Physician:  Jeri Cos. DD:  06/27/01 TD:  06/27/01 Job: [id number]

## 2010-11-16 NOTE — Discharge Summary (Signed)
[facility]  Patient:    SUESAN, MOHRMANN Visit Number: [id number] MRN: [id number]          Service Type: OBS Location: 4A A419 01 Attending Physician:  Jeri Cos. Dictated by:   Langley Gauss, M.D. Admit Date:  06/27/2001 Disc. Date: 06/29/01                             Discharge Summary  DIAGNOSIS:  36-week intrauterine pregnancy in active preterm labor.  PROCEDURES PERFORMED:  On June 27, 2001, placement of continuous lumbar epidural anesthesia and spontaneous assisted vaginal delivery of a 5 pound 7.5 ounce female infant delivered over an intact perineum.  DISPOSITION:  The patient will follow up in the office in four to six weeks time for birth control purposes.  She states that her husband will be receiving a vasectomy.  Notably, the female infant, 5 pounds 7.5 ounces, was placed the entire evening following delivery under an Oxy-Hood with the presumption of TTN.  On June 28, 2001, the patient was rapidly weaned off of the Oxy-Hood.  Dr. Gerda Diss has not yet elevated the infant on June 29, 2001, thus it is indeterminate to me at this point in time whether the patient will be discharged with infant on todays date.  Nevertheless, circumcision is not performed on a.m. round on June 29, 2001.  The patient has indicated that the infant will be added to her insurance policy.  Likewise, the face sheet on the infants chart indicates that the patient will be covered.  Thus, the options at this time include performance of the circumcision today at the hospital if the infant will be discharged today.  Otherwise, a circumcision can be performed the a.m. of June 30, 2001, or can be performed as an outpatient in the office in one weeks time.  I did discuss with Shakeerah Gradel that for whatever reason the insurance on the infant does not provide coverage for the circumcision as a covered procedure that she will be responsible for any  charges incurred.  HOSPITAL COURSE:  The patient progressed to delivery on June 27, 2001, a well-controlled delivery utilizing the epidural analgesic.  The infant initially required blow by and very quickly thereafter very quickly progressed to requiring additional oxygen under the Oxy-Hood.  This was discussed with the patient the a.m. of June 28, 2001, and throughout that night the patient was reassured that the infants status should improve very quickly if it is determined to be TTN.  On June 28, 2001, the patient did well.  She bottle fed the infant.  She had normal lochia.  The fundus was firm.  The patient was thus discharged to home on June 29, 2001.  DISCHARGE MEDICATIONS:  Darvocet-N 100.  FOLLOW-UP:  The patient is to follow up in the office in four weeks time. Dictated by:   Langley Gauss, M.D. Attending Physician:  Jeri Cos DD:  06/29/01 TD:  06/29/01 Job: [id number]

## 2010-11-16 NOTE — Op Note (Signed)
[facility]  Patient:    Amy Lee, Amy Lee Visit Number: [id number] MRN: [id number]          Service Type: MED Location: 4A A414 01 Attending Physician:  Jeri Cos. Dictated by:   Langley Gauss, M.D. Proc. Date: 12/29/01 Admit Date:  12/28/2001 Discharge Date: 12/30/2001                             Operative Report  PROCEDURE PERFORMED:  Prostaglandin evacuation of uterine contents utilizing prostaglandin E2 vaginal suppositories.  COMPLICATIONS:  Delivery was complicated by findings of retained placenta, requiring a trip to the operating room for manual extraction of the retained placenta and intrauterine exploration, followed by a fine banjo curettage of uterine walls.  The procedure was performed by Dr. Langley Gauss.  Delivered was a 2060 gram female infant.  At the time of delivery, no fetal cardiac activity and no spontaneous respirations. No fetal movement was identified.  SUMMARY: The patient was examined on the a.m. of December 29, 2001, at which time her clinical status was noted to be unimproved with continuing hourglassing membranes present within the vaginal vault.  In fact, the hourglassing had progressed to the point that the membranes were visible at the vaginal opening.  Thus, we discussed this with the patient and the certain failure at attempt at cerclage; thus, prostaglandin E2 evacuation of the uterine contents was initiated.  The patient was premedicated on all occasions with p.o. Tylenol and p.o. Lomotil. The patient had a frozen prostaglandin E2 vaginal suppository placed within the posterior portion of the vagina just beneath the cervix.  The first placement resulted in minimal uterine activity; however, the second placement of a prostaglandin suppository did result in the onset of significant uterine activity, pelvic pain, and pressure in the patient. Thereafter, with placement of the second, she began having  significant cramping, as well as some vaginal bleeding, at which time I was evaluated.  At this time, the patient was noted to have spontaneous rupture of membranes with clear amniotic fluid.  No odor identified.  With very gentle pushing efforts, then the infant was noted to delivery in a double footling breech presentation.  Each of the legs was flexed at the hip, resulting in delivery of the legs and feet themselves.  Continued gentle traction resulted in a spontaneous rotation to a back-up position.  The arms were then gently flexed across the infants chest anteriorly.  Very gentle traction was performed.  I was able to flex the infants head, which then allowed delivery through the cervix.  This was done with great care with minimal trauma to the infant.  As stated previously, the infant has no evidence of fetal life.  The umbilical cord is then doubly clamped and cut, and the infant is handed to the nursing personnel for appropriate handling.  At this point in time, the placenta remains in place.  It is clamped, left in place to facilitate renewed uterine activity, and additional prostaglandin E2 vaginal suppository is placed posteriorly. This did result in some crampy-type pain to the patient.  After a two-hour period, she was again examined and noted to have the onset of moderate vaginal bleeding; however, with very gentle and minimal traction on the umbilical cord, which is able to be traced up to its insertion on the placenta, there was no separation of the placenta, and this is poorly tolerated by the patient. Thus, I discussed with  the patient the necessity to proceed to the operating room for continued management.  The patient consented to this.  The risks and benefits were discussed with the patient.  The anesthesia staff was likewise made aware of the patients clinical presentation.  She is taken to the operating room where she undergoes induction of general endotracheal  anesthesia, at which time she is placed in the dorsolithotomy position.  She is examined at this time and noted to have only moderate vaginal bleeding occurring.  With the significant analgesia effect of the general anesthesia, I was able to easily grasp the presenting portion of the placenta utilizing my hand, and gentle manipulation then results in complete delivery upon examination, which appears to be an intact placenta, which is handed off for permanent specimen only.  An anterior exploration at this time reveals no evidence of any retained placental fragments.  A fine banjo curettage utilizing a very large banjo curette is then performed in the entire uterine cavity until an appropriate gritty sensation was appreciated in all quadrants of the uterus itself to include the uterine fundus.  In addition, curettage efforts resulted in no additional removal of tissue.  Thus, the procedure was terminated.  Minimal vaginal bleeding was noted to be occurring at this time. The patient is treated with 0.2 mg of IM Methergine.  The placenta removed is sent off for permanent specimen only.  The patient tolerated the procedure very well with an estimated blood loss of 400 cc.  She is to be taken to the recovery room in stable condition.  She will be treated with 1 gm of IV Rocephin to minimize possibility of postoperative endometritis.  After reversing anesthesia, the patient is taken to the recovery room in stable condition.  The operative findings were discussed with the patients awaiting family. Dictated by:   Langley Gauss, M.D. Attending Physician:  Jeri Cos. DD:  01/10/02 TD:  01/12/02 Job: [id number]

## 2010-11-16 NOTE — Discharge Summary (Signed)
[facility]  Patient:    Amy Lee, HARROUN Visit Number: [id number] MRN: [id number]          Service Type: OBS Location: 4A A428 01 Attending Physician:  Jeri Cos. Dictated by:   Langley Gauss, M.D. Admit Date:  11/05/2001 Discharge Date: 11/07/2001                             Discharge Summary  DISCHARGE DIAGNOSES: 1. A 12 week intrauterine pregnancy. 2. Hyperemesis gravidarum with nausea and vomiting, dehydration, and diarrhea.  DISPOSITION AT TIME OF DISCHARGE:  The patient is given instructions regarding a low fat bland diet.  She will follow up in the office in 2 weeks time or sooner if clinically indicated.  DISCHARGE MEDICATIONS: 1. The patient does have p.o. Phenergan at home for use.  Likewise she has had    some reflux for which she is taking p.o. Zantac. 2. The patient likewise is taking Prilosec for relief of her significant    reflux symptoms. 3. The patient was treated during this hospitalization with IV Zofran for    relief of the nausea and vomiting.  LABORATORY DATA:  Described previously.  HOSPITAL COURSE:  The patient initially presented on Nov 05, 2001, late in the p.m. at which time verbal orders were given for her observation status for treatment of the nausea and vomiting with dehydration.  On the date of Nov 06, 2001, the patient was known to continue to have a very poor p.o. intake with no appetite.  She has had some relief of the nausea with the administration of Phenergan.  The patient, however, did not have any improvement in her dietary intake.  Thus, IV fluids are continued on Nov 06, 2001.  The patient was encouraged to continue with ambulation.  After initiating the Zofran the patient did have improvement of her diet.  Lomotil likewise resolved the diarrhea and on Nov 07, 2001, the patient was feeling much better with improved spirits.  Energy level likewise improved, color improved.  The patient was  tolerating a regular general diet and was able to tolerate p.o. medications and thus discharged on Nov 07, 2001. Dictated by:   Langley Gauss, M.D. Attending Physician:  Jeri Cos. DD:  11/26/01 TD:  11/27/01 Job: [id number]

## 2010-11-16 NOTE — Discharge Summary (Signed)
[facility]  Patient:    Amy Lee, Amy Lee Visit Number: [id number] MRN: [id number]          Service Type: MED Location: 4A A414 01 Attending Physician:  Jeri Cos. Dictated by:   Langley Gauss, M.D. Admit Date:  12/28/2001 Discharge Date: 12/30/2001                             Discharge Summary  DATE OF PROCEDURES PERFORMED:  December 29, 2001.  PROCEDURE PERFORMED:  Prostaglandin E2 evacuation of the uterine contents was performed as well as manual expiration, manual removal of placenta and uterine curettage in the operating room.  DISPOSITION:  Discharged to home.  PERTINENT LABORATORY DATA:  RPRs are nonreactive. Hemoglobin 8.5, hematocrits 10.5/29.1, O positive blood type.  HOSPITAL COURSE:  See previous dictations. The patient underwent the manual expiration of routine placenta on the p.m. of December 29, 2001. The day after, she did remarkably well with minimal vaginal bleeding occurring. She did have some significant uterine cramping in an effort to involute the uterus. The patient was treated with IV narcotics as clinically needed, also was treated with Rocephin 1 gram IV q.24 h. The patient did well. Family consultations were held at which time they decided at the 19-1/[redacted] weeks gestation with the weight of 260 grams only that the remains would be cared for by the hospital and no formal burial service would be performed. All questions were answered of the patient during this hospitalization. Great efforts were made to have the patient and the patients family understand her clinical status. Thus, the patient is discharged to home on December 30, 2001.  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 Dictated by:   Langley Gauss, M.D. Attending Physician:  Jeri Cos. DD:  01/10/02 TD:  01/12/02 Job: [id number]

## 2010-11-16 NOTE — Op Note (Signed)
[facility]  Patient:    Amy Lee, Amy Lee Visit Number: [id number] MRN: [id number]          Service Type: OBS Location: 4A A419 01 Attending Physician:  Jeri Cos. Dictated by:   Langley Gauss, M.D. Proc. Date: 06/27/01 Admit Date:  06/27/2001                             Operative Report  PROCEDURE  Placement of continuous lumbar epidural analgesia at the L2-L3 interspace performed by Roylene Reason. Lisette Grinder, M.D.  COMPLICATIONS:  None.  SUMMARY:  Patient requested epidural analgesia.  We discussed epidural analgesia during the prenatal course.  Continuous electronic fetal monitoring was performed.  Patient was placed in a seated position at which time bony landmarks were identified.  The L2-L3 innerspace is chosen.  Patients back is sterilely prepped and draped in the usual manner.  Lidocaine 5 cc 1% injected at the midline of the L2-L3 innerspace to raise a small skin wheel.  The 17 gauge Tooy Schliff needle is then utilized to identify the epidural space with loss of resistance and air filled syringe.  On the first two attempts the bony spinous process was encountered, thus the epidural needles removed and placed in the skin slightly cephalic.  On this third attempt there is excellent loss of resistance with entry into the epidural space.  Initial test dose 3 cc 1.5% lidocaine plus epinephrine injected through the epidural needle.  Epidural catheter is then inserted to a depth of 5 cm.  Epidural needle is removed. Aspiration test is negative.  Second test dose 2 cc 1.5% lidocaine plus epinephrine injected through the epidural catheter.  Again, no signs of CSF or intravascular injection obtained, thus the catheter is secured into place. Patient is connected to the ______ pump containing a standard mixture consisting of a bolus of 10 cc followed by a continuous infusion rate of 14 cc/hour.  Fetal heart rate remains reassuring prior to and post  the procedure. Dictated by:   Langley Gauss, M.D. Attending Physician:  Jeri Cos. DD:  06/27/01 TD:  06/28/01 Job: [id number]

## 2011-01-29 LAB — HM PAP SMEAR

## 2011-03-25 LAB — CBC
HCT: 31.2 — ABNORMAL LOW
HCT: 35.1 — ABNORMAL LOW
Hemoglobin: 11.3 — ABNORMAL LOW
Hemoglobin: 12.6
MCHC: 35.6
MCHC: 36
MCV: 87.2
Platelets: 153
Platelets: 189
RBC: 3.98
RBC: 4.07
RDW: 12.5
RDW: 12.5
RDW: 12.6
RDW: 12.6
WBC: 3.5 — ABNORMAL LOW

## 2011-03-25 LAB — BASIC METABOLIC PANEL
BUN: 9
CO2: 26
CO2: 27
Calcium: 8.4
Calcium: 8.5
Chloride: 105
Chloride: 107
Creatinine, Ser: 0.87
GFR calc Af Amer: >60
GFR calc non Af Amer: >60
GFR calc non Af Amer: >60
Glucose, Bld: 102 — ABNORMAL HIGH
Glucose, Bld: 88
Glucose, Bld: 97
Potassium: 3.5
Potassium: 3.6
Sodium: 139
Sodium: 139

## 2011-03-25 LAB — FECAL LACTOFERRIN, QUANT

## 2011-03-25 LAB — URINE MICROSCOPIC-ADD ON

## 2011-03-25 LAB — HEPATIC FUNCTION PANEL
ALT: 22
AST: 24
Bilirubin, Direct: 0.1
Indirect Bilirubin: 0.4
Total Bilirubin: 0.5

## 2011-03-25 LAB — URINALYSIS, ROUTINE W REFLEX MICROSCOPIC
Glucose, UA: NEGATIVE
Ketones, ur: NEGATIVE
Leukocytes, UA: NEGATIVE
Leukocytes, UA: NEGATIVE
Nitrite: NEGATIVE
Nitrite: NEGATIVE
Protein, ur: 100 — AB
Protein, ur: 30 — AB
Specific Gravity, Urine: 1.025
Specific Gravity, Urine: >1.03 — ABNORMAL HIGH
Urobilinogen, UA: 0.2
Urobilinogen, UA: 1
pH: 5.5

## 2011-03-25 LAB — DIFFERENTIAL
Basophils Absolute: 0
Basophils Absolute: 0
Basophils Relative: 0
Basophils Relative: 1
Basophils Relative: 1
Eosinophils Absolute: 0
Eosinophils Absolute: 0.1
Eosinophils Relative: 1
Eosinophils Relative: 2
Eosinophils Relative: 3
Lymphocytes Relative: 29
Lymphocytes Relative: 54 — ABNORMAL HIGH
Lymphs Abs: 2.1
Monocytes Absolute: 0.3
Monocytes Absolute: 0.4
Monocytes Absolute: 0.5
Monocytes Relative: 12
Monocytes Relative: 7
Monocytes Relative: 9
Neutro Abs: 1.1 — ABNORMAL LOW
Neutro Abs: 2.2
Neutrophils Relative %: 85 — ABNORMAL HIGH

## 2011-03-25 LAB — COMPREHENSIVE METABOLIC PANEL
ALT: 29
AST: 24
Albumin: 2.6 — ABNORMAL LOW
Alkaline Phosphatase: 36 — ABNORMAL LOW
Chloride: 110
GFR calc Af Amer: >60
Potassium: 3.2 — ABNORMAL LOW
Sodium: 140
Total Protein: 4.4 — ABNORMAL LOW

## 2011-03-25 LAB — IRON AND TIBC: UIBC: 168

## 2011-03-25 LAB — CLOSTRIDIUM DIFFICILE EIA: C difficile Toxins A+B, EIA: NEGATIVE

## 2011-03-25 LAB — STOOL CULTURE

## 2011-03-25 LAB — MAGNESIUM: Magnesium: 1.6

## 2011-03-25 LAB — AMYLASE: Amylase: 26 — ABNORMAL LOW

## 2011-03-25 LAB — FERRITIN: Ferritin: 116 (ref 10–291)

## 2011-03-26 LAB — CBC
HCT: 31.9 — ABNORMAL LOW
Hemoglobin: 11.4 — ABNORMAL LOW
MCHC: 35.6
MCV: 87.3
Platelets: 200
RBC: 3.65 — ABNORMAL LOW
RDW: 12.7
WBC: 5.1

## 2011-03-26 LAB — BASIC METABOLIC PANEL WITH GFR
BUN: 3 — ABNORMAL LOW
CO2: 31
Calcium: 8.5
Chloride: 108
Creatinine, Ser: 0.82
GFR calc non Af Amer: >60
Glucose, Bld: 91
Potassium: 3.6
Sodium: 142

## 2011-03-26 LAB — DIFFERENTIAL
Basophils Relative: 1
Lymphocytes Relative: 47 — ABNORMAL HIGH
Monocytes Absolute: 0.4
Monocytes Relative: 8
Neutro Abs: 2.1
Neutrophils Relative %: 42 — ABNORMAL LOW

## 2012-01-13 ENCOUNTER — Encounter: Payer: Self-pay | Admitting: Gastroenterology

## 2012-01-14 ENCOUNTER — Ambulatory Visit: Payer: Self-pay | Admitting: Urgent Care

## 2012-01-22 ENCOUNTER — Ambulatory Visit: Payer: Self-pay | Admitting: Urgent Care

## 2012-07-01 DIAGNOSIS — J189 Pneumonia, unspecified organism: Secondary | ICD-10-CM

## 2012-07-01 HISTORY — DX: Pneumonia, unspecified organism: J18.9

## 2012-07-03 ENCOUNTER — Emergency Department (HOSPITAL_COMMUNITY)
Admission: EM | Admit: 2012-07-03 | Discharge: 2012-07-03 | Disposition: A | Discharge status: Home / Self Care | Payer: Managed Care, Other (non HMO) | Attending: Emergency Medicine | Admitting: Emergency Medicine

## 2012-07-03 ENCOUNTER — Encounter (HOSPITAL_COMMUNITY): Payer: Self-pay | Admitting: Emergency Medicine

## 2012-07-03 ENCOUNTER — Emergency Department (HOSPITAL_COMMUNITY): Payer: Managed Care, Other (non HMO)

## 2012-07-03 DIAGNOSIS — R110 Nausea: Secondary | ICD-10-CM | POA: Insufficient documentation

## 2012-07-03 DIAGNOSIS — R5383 Other fatigue: Secondary | ICD-10-CM | POA: Insufficient documentation

## 2012-07-03 DIAGNOSIS — R5381 Other malaise: Secondary | ICD-10-CM | POA: Insufficient documentation

## 2012-07-03 DIAGNOSIS — R197 Diarrhea, unspecified: Secondary | ICD-10-CM | POA: Insufficient documentation

## 2012-07-03 DIAGNOSIS — IMO0001 Reserved for inherently not codable concepts without codable children: Secondary | ICD-10-CM | POA: Insufficient documentation

## 2012-07-03 DIAGNOSIS — J111 Influenza due to unidentified influenza virus with other respiratory manifestations: Secondary | ICD-10-CM

## 2012-07-03 DIAGNOSIS — R05 Cough: Secondary | ICD-10-CM | POA: Insufficient documentation

## 2012-07-03 DIAGNOSIS — R059 Cough, unspecified: Secondary | ICD-10-CM | POA: Insufficient documentation

## 2012-07-03 MED ORDER — SODIUM CHLORIDE 0.9 % IV SOLN
Freq: Once | INTRAVENOUS | Status: AC
  Administered 2012-07-03: 1000 mL via INTRAVENOUS

## 2012-07-03 MED ORDER — ACETAMINOPHEN 500 MG PO TABS
1000.0000 mg | ORAL_TABLET | Freq: Once | ORAL | Status: AC
  Administered 2012-07-03: 1000 mg via ORAL
  Filled 2012-07-03: qty 2

## 2012-07-03 MED ORDER — KETOROLAC TROMETHAMINE 60 MG/2ML IM SOLN
60.0000 mg | Freq: Once | INTRAMUSCULAR | Status: AC
  Administered 2012-07-03: 60 mg via INTRAMUSCULAR
  Filled 2012-07-03: qty 2

## 2012-07-03 MED ORDER — HYDROCODONE-HOMATROPINE 5-1.5 MG/5ML PO SYRP: 5.0000 mL | ORAL_SOLUTION | Freq: Four times a day (QID) | ORAL | Status: DC | PRN

## 2012-07-03 MED ORDER — IBUPROFEN 800 MG PO TABS: 800.0000 mg | ORAL_TABLET | Freq: Three times a day (TID) | ORAL | Status: DC

## 2012-07-03 MED ORDER — IBUPROFEN 800 MG PO TABS
800.0000 mg | ORAL_TABLET | Freq: Once | ORAL | Status: AC
  Administered 2012-07-03: 800 mg via ORAL
  Filled 2012-07-03: qty 1

## 2012-07-03 NOTE — ED Notes (Signed)
Pt states flu like symptoms for several days.

## 2012-07-03 NOTE — ED Notes (Signed)
Pt requesting bag of IVF.  REports last time she was sick she went home and passed out.  Notified EDP and liter bolus ordered.

## 2012-07-03 NOTE — ED Provider Notes (Signed)
History     CSN: [id number]  Arrival date & time 07/03/12  1413   First MD Initiated Contact with Patient 07/03/12 1444      Chief Complaint  Patient presents with  . Influenza    (Consider location/radiation/quality/duration/timing/severity/associated sxs/prior treatment) HPI Comments: Patient comes to the ER for evaluation of flulike symptoms. Patient has been sick for 3 days. She reports fever, chills, body aches. Patient has had a cough which has been progressively worsening and painful. Pain is in the back and rib area. She has had nausea but no vomiting. She had one episode of diarrhea.  Patient is a 37 y.o. female presenting with flu symptoms.  Influenza    History reviewed. No pertinent past medical history.  Past Surgical History  Procedure Date  . Ileocolonoscopy 10/19/2007    Normal terminal ileum approximately 10-20 cm visualized/ Normal colon / Large internal hemorrhoids/ nl colon. Melanosis coli  . Cholecystectomy     History reviewed. No pertinent family history.  History  Substance Use Topics  . Smoking status: Never Smoker   . Smokeless tobacco: Not on file  . Alcohol Use: No    OB History    Grav Para Term Preterm Abortions TAB SAB Ect Mult Living                  Review of Systems  Constitutional: Positive for fever, chills and fatigue.  Respiratory: Positive for cough.   Gastrointestinal: Positive for nausea and diarrhea. Negative for vomiting.  All other systems reviewed and are negative.    Allergies  Ceclor; Fioricet-codeine; and Stadol  Home Medications  No current outpatient prescriptions on file.  BP 129/72  Pulse 126  Temp 102.6 F (39.2 C) (Oral)  Resp 20  Ht 5\' 5"  (1.651 m)  Wt 165 lb (74.844 kg)  BMI 27.46 kg/m2  SpO2 100%  Physical Exam  Constitutional: She is oriented to person, place, and time. She appears well-developed and well-nourished. No distress.  HENT:  Head: Normocephalic and atraumatic.  Right Ear:  Hearing normal.  Nose: Nose normal.  Mouth/Throat: Oropharynx is clear and moist and mucous membranes are normal.  Eyes: Conjunctivae normal and EOM are normal. Pupils are equal, round, and reactive to light.  Neck: Normal range of motion. Neck supple.  Cardiovascular: Normal rate, regular rhythm, S1 normal and S2 normal.  Exam reveals no gallop and no friction rub.   No murmur heard. Pulmonary/Chest: Effort normal and breath sounds normal. No respiratory distress. She exhibits no tenderness.  Abdominal: Soft. Normal appearance and bowel sounds are normal. There is no hepatosplenomegaly. There is no tenderness. There is no rebound, no guarding, no tenderness at McBurney's point and negative Murphy's sign. No hernia.  Musculoskeletal: Normal range of motion.  Neurological: She is alert and oriented to person, place, and time. She has normal strength. No cranial nerve deficit or sensory deficit. Coordination normal. GCS eye subscore is 4. GCS verbal subscore is 5. GCS motor subscore is 6.  Skin: Skin is warm, dry and intact. No rash noted. No cyanosis.  Psychiatric: She has a normal mood and affect. Her speech is normal and behavior is normal. Thought content normal.    ED Course  Procedures (including critical care time)  Labs Reviewed - No data to display Dg Chest 2 View  07/03/2012  *RADIOLOGY REPORT*  Clinical Data: Influenza, cough, fever, chills  CHEST - 2 VIEW  Comparison: 09/29/2007  Findings: Grossly unchanged cardiac silhouette and mediastinal contours given slightly  reduced lung volumes.  No focal airspace opacity.  No pleural effusion or pneumothorax.  Unchanged bones. Post cholecystectomy.  IMPRESSION: No acute cardiopulmonary disease.   Original Report Authenticated By: Tacey Ruiz, MD      1. Influenza       MDM  Patient comes to the ER for evaluation of flulike symptoms. Main complaint is cough which is painful. The chest x-ray was performed and was negative for pneumonia.  Patient insisted that she needs IV fluids because she has had similar symptoms in the past. She was quickly hydrated here in the ER will be discharged with symptomatic treatment.        Gilda Crease, MD 07/03/12 1540

## 2012-07-03 NOTE — ED Notes (Signed)
Discharge instructions reviewed with pt, questions answered. Pt verbalized understanding.  

## 2012-07-03 NOTE — ED Notes (Signed)
Pt c/o generalized body aches, fever, and productive cough x 2 or 3 days.  Reports has been taking tylenol and ibuprofen for fever without relief.

## 2012-07-09 ENCOUNTER — Ambulatory Visit (HOSPITAL_COMMUNITY)
Admission: RE | Admit: 2012-07-09 | Discharge: 2012-07-09 | Disposition: A | Discharge status: Home / Self Care | Payer: Managed Care, Other (non HMO) | Source: Ambulatory Visit | Attending: Family Medicine | Admitting: Family Medicine

## 2012-07-09 ENCOUNTER — Other Ambulatory Visit (HOSPITAL_COMMUNITY): Payer: Self-pay | Admitting: Family Medicine

## 2012-07-09 DIAGNOSIS — J069 Acute upper respiratory infection, unspecified: Secondary | ICD-10-CM | POA: Insufficient documentation

## 2012-07-09 DIAGNOSIS — J111 Influenza due to unidentified influenza virus with other respiratory manifestations: Secondary | ICD-10-CM

## 2013-07-23 ENCOUNTER — Ambulatory Visit (INDEPENDENT_AMBULATORY_CARE_PROVIDER_SITE_OTHER): Payer: Managed Care, Other (non HMO) | Admitting: Nurse Practitioner

## 2013-07-23 ENCOUNTER — Encounter: Payer: Self-pay | Admitting: Nurse Practitioner

## 2013-07-23 VITALS — BP 116/70 | HR 80 | Temp 98.4°F | Ht 64.5 in | Wt 182.0 lb

## 2013-07-23 DIAGNOSIS — B373 Candidiasis of vulva and vagina: Secondary | ICD-10-CM

## 2013-07-23 DIAGNOSIS — B3731 Acute candidiasis of vulva and vagina: Secondary | ICD-10-CM

## 2013-07-23 DIAGNOSIS — N390 Urinary tract infection, site not specified: Secondary | ICD-10-CM

## 2013-07-23 MED ORDER — CIPROFLOXACIN HCL 500 MG PO TABS: 500.0000 mg | ORAL_TABLET | Freq: Two times a day (BID) | ORAL | Status: DC

## 2013-07-23 MED ORDER — NONFORMULARY OR COMPOUNDED ITEM: Status: DC

## 2013-07-23 MED ORDER — FLUCONAZOLE 150 MG PO TABS: ORAL_TABLET | ORAL | Status: DC

## 2013-07-23 NOTE — Progress Notes (Signed)
Subjective:     Patient ID: Amy Lee, female   DOB: [birth year], 38 y.o.   MRN: [id number]  This 38 yo WM Fe presents with UTI and vaginitis symptoms since Wednesday.  Had urinary odor very strong that am.  She started on AZO prn.  Later in the day noted an increase in urgency and frequency.  Yesterday noted white vagina discharge and itching.  She has a history of chronic yeast infections.  Denies back pain fever/ chills.  Last SA on Tuesday night which she thinks started these symptoms.  Urinary Tract Infection  Associated symptoms include frequency and urgency. Pertinent negatives include no chills, nausea or vomiting.    Review of Systems  Constitutional: Negative for fever, chills and fatigue.  Gastrointestinal: Negative for nausea, vomiting, abdominal pain, diarrhea and constipation.  Genitourinary: Positive for urgency, frequency and vaginal discharge.  Musculoskeletal: Negative.   Skin: Negative.   Neurological: Negative.   Psychiatric/Behavioral: Negative.        Objective:   Physical Exam  Constitutional: She is oriented to person, place, and time. She appears well-developed and well-nourished. No distress.  Abdominal: Soft. She exhibits no distension. There is no tenderness. There is no rebound and no guarding.  Genitourinary:  White thin vaginal discharge. No other lesions. Wet Prep: PH: 4.5; NSS negative; KOH: + yeast.  unavle to test urine today secondary to AZO.  Neurological: She is alert and oriented to person, place, and time.  Psychiatric: She has a normal mood and affect. Her behavior is normal. Judgment and thought content normal.       Assessment:     Yeast vaginitis R/O UTI History of acute and chronic yeast    Plan:     Cipro 500 mg BID # 14 and will call with culture results Boric Acid cap every 3 days and for treatment daily x 7, also after SA Will treat with Diflucan 150 mg weekly X 4 If symptoms are not better to CB.

## 2013-07-23 NOTE — Patient Instructions (Signed)
Monilial Vaginitis  Vaginitis in a soreness, swelling and redness (inflammation) of the vagina and vulva. Monilial vaginitis is not a sexually transmitted infection.  CAUSES   Yeast vaginitis is caused by yeast (candida) that is normally found in your vagina. With a yeast infection, the candida has overgrown in number to a point that upsets the chemical balance.  SYMPTOMS   · White, thick vaginal discharge.  · Swelling, itching, redness and irritation of the vagina and possibly the lips of the vagina (vulva).  · Burning or painful urination.  · Painful intercourse.  DIAGNOSIS   Things that may contribute to monilial vaginitis are:  · Postmenopausal and virginal states.  · Pregnancy.  · Infections.  · Being tired, sick or stressed, especially if you had monilial vaginitis in the past.  · Diabetes. Good control will help lower the chance.  · Birth control pills.  · Tight fitting garments.  · Using bubble bath, feminine sprays, douches or deodorant tampons.  · Taking certain medications that kill germs (antibiotics).  · Sporadic recurrence can occur if you become ill.  TREATMENT   Your caregiver will give you medication.  · There are several kinds of anti monilial vaginal creams and suppositories specific for monilial vaginitis. For recurrent yeast infections, use a suppository or cream in the vagina 2 times a week, or as directed.  · Anti-monilial or steroid cream for the itching or irritation of the vulva may also be used. Get your caregiver's permission.  · Painting the vagina with methylene blue solution may help if the monilial cream does not work.  · Eating yogurt may help prevent monilial vaginitis.  HOME CARE INSTRUCTIONS   · Finish all medication as prescribed.  · Do not have sex until treatment is completed or after your caregiver tells you it is okay.  · Take warm sitz baths.  · Do not douche.  · Do not use tampons, especially scented ones.  · Wear cotton underwear.  · Avoid tight pants and panty  hose.  · Tell your sexual partner that you have a yeast infection. They should go to their caregiver if they have symptoms such as mild rash or itching.  · Your sexual partner should be treated as well if your infection is difficult to eliminate.  · Practice safer sex. Use condoms.  · Some vaginal medications cause latex condoms to fail. Vaginal medications that harm condoms are:  · Cleocin cream.  · Butoconazole (Femstat®).  · Terconazole (Terazol®) vaginal suppository.  · Miconazole (Monistat®) (may be purchased over the counter).  SEEK MEDICAL CARE IF:   · You have a temperature by mouth above 102° F (38.9° C).  · The infection is getting worse after 2 days of treatment.  · The infection is not getting better after 3 days of treatment.  · You develop blisters in or around your vagina.  · You develop vaginal bleeding, and it is not your menstrual period.  · You have pain when you urinate.  · You develop intestinal problems.  · You have pain with sexual intercourse.  Document Released: 03/27/2005 Document Revised: 09/09/2011 Document Reviewed: 12/09/2008  [employer]® Patient Information ©2014 [employer].

## 2013-07-24 LAB — URINALYSIS, MICROSCOPIC ONLY
BACTERIA UA: NONE SEEN
CRYSTALS: NONE SEEN
Casts: NONE SEEN
WBC, UA: >50 WBC/hpf — AB (ref <3)

## 2013-07-26 LAB — URINE CULTURE: Colony Count: >=100000

## 2013-07-28 ENCOUNTER — Telehealth: Payer: Self-pay | Admitting: *Deleted

## 2013-07-28 MED ORDER — NONFORMULARY OR COMPOUNDED ITEM: Status: DC

## 2013-07-28 NOTE — Telephone Encounter (Signed)
I have attempted to contact this patient by phone with the following results: left message to return my call on answering machine (home/mobile).  

## 2013-07-28 NOTE — Progress Notes (Signed)
Reviewed personally.  M. Suzanne Lucendia Leard, MD.  

## 2013-07-28 NOTE — Telephone Encounter (Signed)
Results notified.  Boric Acid supp sent to [facility].  RX did not go at last visit.

## 2013-07-28 NOTE — Telephone Encounter (Signed)
Message copied by Graylon Good on Wed Jul 28, 2013 10:53 AM ------      Message from: GRUBB, PATRICIA R      Created: Mon Jul 26, 2013  9:24 AM       Let patient know that urine culture is positive - she is on correct med's.  Get  TOC in 2 weeks.  Nurse visit is OK. ------

## 2013-08-20 ENCOUNTER — Encounter: Payer: Self-pay | Admitting: Nurse Practitioner

## 2013-08-23 ENCOUNTER — Ambulatory Visit (INDEPENDENT_AMBULATORY_CARE_PROVIDER_SITE_OTHER): Payer: Managed Care, Other (non HMO) | Admitting: Nurse Practitioner

## 2013-08-23 ENCOUNTER — Encounter: Payer: Self-pay | Admitting: Nurse Practitioner

## 2013-08-23 VITALS — BP 110/74 | HR 84 | Ht 65.25 in | Wt 185.0 lb

## 2013-08-23 DIAGNOSIS — N925 Other specified irregular menstruation: Secondary | ICD-10-CM

## 2013-08-23 DIAGNOSIS — R87810 Cervical high risk human papillomavirus (HPV) DNA test positive: Secondary | ICD-10-CM

## 2013-08-23 DIAGNOSIS — N920 Excessive and frequent menstruation with regular cycle: Secondary | ICD-10-CM

## 2013-08-23 DIAGNOSIS — Z0000 Encounter for general adult medical examination without abnormal findings: Secondary | ICD-10-CM

## 2013-08-23 DIAGNOSIS — N938 Other specified abnormal uterine and vaginal bleeding: Secondary | ICD-10-CM

## 2013-08-23 DIAGNOSIS — N949 Unspecified condition associated with female genital organs and menstrual cycle: Secondary | ICD-10-CM

## 2013-08-23 DIAGNOSIS — Z30432 Encounter for removal of intrauterine contraceptive device: Secondary | ICD-10-CM

## 2013-08-23 NOTE — Progress Notes (Signed)
Patient ID: Amy Lee, female   DOB: [birth year], 38 y.o.   MRN: [id number] 37 y.o. [id number] Married Caucasian Fe here for annual exam.  MIrena IUD 4/20111.  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  She is here with husband to discuss other birth control options.  He would have a vasectomy but then she would go back to having heavy and prolonged menses. She does not want to be on OCP secondary to age and risk.  Her goal is not to have a menstrual cycle on whatever method she chose. She is interested in HTA and ESSURE.     No LMP recorded. Patient is not currently having periods (Reason: IUD).          Sexually active: yes  The current method of family planning is IUD.    Exercising: no  The patient does not participate in regular exercise at present. Smoker:  no  Health Maintenance: Pap:  01/29/11, WNL Colonoscopy:  09/2007, melanosis TDaP:  01/29/2011 Labs:  HB: PCP  Urine: declined   reports that she has never smoked. She has never used smokeless tobacco. She reports that she does not drink alcohol or use illicit drugs.  Past Medical History  Diagnosis Date  . History of abnormal cervical Pap smear 2008    LGSIL    Past Surgical History  Procedure Laterality Date  . Ileocolonoscopy  10/19/2007    Normal terminal ileum approximately 10-20 cm visualized/ Normal colon / Large internal hemorrhoids/ nl colon. Melanosis coli  . Cholecystectomy    . Colposcopy  7/08    pos HPV  . Intrauterine device insertion  09/2009    Mirena    Current Outpatient Prescriptions  Medication Sig Dispense Refill  . ibuprofen (ADVIL,MOTRIN) 200 MG tablet Take 800 mg by mouth every 6 (six) hours as needed. Pain      . levonorgestrel (MIRENA) 20 MCG/24HR IUD 1 each by Intrauterine route once.      Marland Kitchen omeprazole (PRILOSEC) 20 MG capsule Take 20 mg by mouth daily.      . NONFORMULARY OR COMPOUNDED ITEM Boric acid suppository 200mg , size 0 gelatin  capsule,1 cap 3 times weekly.  With flare use daily pv for 7 days and after SA.  30 each  12   No current facility-administered medications for this visit.    Family History  Problem Relation Age of Onset  . Diabetes Mother   . Hyperlipidemia Father   . Lung cancer Maternal Grandmother   . Cancer Paternal Grandmother   . Colon cancer Paternal Grandmother   . Thyroid disease Paternal Grandmother   . Heart failure Paternal Grandfather   . Lung cancer Maternal Aunt     ROS:  Pertinent items are noted in HPI.  Otherwise, a comprehensive ROS was negative.  Exam:   BP 110/74  Pulse 84  Ht 5' 5.25" (1.657 m)  Wt 185 lb (83.915 kg)  BMI 30.56 kg/m2 Height: 5' 5.25" (165.7 cm)  Ht Readings from Last 3 Encounters:  08/23/13 5' 5.25" (1.657 m)  07/23/13 5' 4.5" (1.638 m)  07/03/12 5\' 5"  (1.651 m)    General appearance: alert, cooperative and appears stated age Head: Normocephalic, without obvious abnormality, atraumatic Neck: no adenopathy, supple, symmetrical, trachea midline and thyroid normal to inspection and palpation Lungs: clear to auscultation bilaterally Breasts: normal appearance, no masses or tenderness Heart: regular rate and rhythm Abdomen: soft, non-tender; no masses,  no organomegaly Extremities: extremities normal, atraumatic, no cyanosis or edema Skin: Skin color, texture, turgor normal. No rashes or lesions Lymph nodes: Cervical, supraclavicular, and axillary nodes normal. No abnormal inguinal nodes palpated Neurologic: Grossly normal   Pelvic: External genitalia:  no lesions              Urethra:  normal appearing urethra with no masses, tenderness or lesions              Bartholin's and Skene's: normal                 Vagina: normal appearing vagina with normal color and discharge, no lesions              Cervix: anteverted              Pap taken: yes Bimanual Exam:  Uterus:  normal size, contour, position, consistency, mobility, non-tender               Adnexa: no mass, fullness, tenderness               Rectovaginal: Confirms               Anus:  normal sphincter tone, no lesions  A:  Well Woman with normal exam  Mirena IUD 09/2009  History of abnormal pap 2008 with HR HPV changes only  Discuss birth control options  History of chronic yeast  P:   Pap smear as per guidelines done today   Mammogram due at age 51  Will continue with Boric acid capsule as directed - just got a new RX  Given information about HTA and ESSURE  Counseled on breast self exam, use and side effects of OCP's, adequate intake of calcium and vitamin D, diet and exercise return annually or prn  An After Visit Summary was printed and given to the patient.

## 2013-08-23 NOTE — Patient Instructions (Signed)

## 2013-08-24 ENCOUNTER — Encounter: Payer: Self-pay | Admitting: Nurse Practitioner

## 2013-08-24 DIAGNOSIS — N920 Excessive and frequent menstruation with regular cycle: Secondary | ICD-10-CM | POA: Insufficient documentation

## 2013-08-24 NOTE — Progress Notes (Addendum)
Reviewed personally.  Felipa Emory, MD.  OK to plan [facility], endometrial biopsy, and IUD removal.  Orders placed.  Will discuss procedure and risks/benefits at time of ultrasound.  Please make sure pt is aware.

## 2013-08-25 LAB — IPS PAP TEST WITH HPV

## 2013-08-27 NOTE — Addendum Note (Signed)
Addended by: Tacy Learn, BROOK E on: 08/27/2013 05:19 PM   Modules accepted: Orders

## 2013-08-27 NOTE — Progress Notes (Signed)
Done

## 2013-08-28 LAB — IPS HPV GENOTYPING 16/18

## 2013-08-30 ENCOUNTER — Telehealth: Payer: Self-pay | Admitting: Nurse Practitioner

## 2013-08-30 NOTE — Telephone Encounter (Signed)
Pt says she is following up on a conversation with stephanie regarding insurance benefits for ultrasound.

## 2013-08-30 NOTE — Telephone Encounter (Signed)
Telephoned patient/advised of quoted benefit of $50 copay for PUS, iud removal, and endo bx/ scheduled procedures/advised patient of cancellation policy and fee//ssf

## 2013-09-16 ENCOUNTER — Ambulatory Visit (INDEPENDENT_AMBULATORY_CARE_PROVIDER_SITE_OTHER): Payer: Managed Care, Other (non HMO)

## 2013-09-16 ENCOUNTER — Ambulatory Visit (INDEPENDENT_AMBULATORY_CARE_PROVIDER_SITE_OTHER): Payer: Managed Care, Other (non HMO) | Admitting: Obstetrics & Gynecology

## 2013-09-16 DIAGNOSIS — N938 Other specified abnormal uterine and vaginal bleeding: Secondary | ICD-10-CM

## 2013-09-16 DIAGNOSIS — N949 Unspecified condition associated with female genital organs and menstrual cycle: Secondary | ICD-10-CM

## 2013-09-16 DIAGNOSIS — Z30432 Encounter for removal of intrauterine contraceptive device: Secondary | ICD-10-CM

## 2013-09-16 DIAGNOSIS — N920 Excessive and frequent menstruation with regular cycle: Secondary | ICD-10-CM

## 2013-09-16 DIAGNOSIS — N925 Other specified irregular menstruation: Secondary | ICD-10-CM

## 2013-09-16 NOTE — Progress Notes (Signed)
38 y.o.Marriedfemale here for a pelvic ultrasound due to hx of menorrhagia and current BTB with IUD.  She is frustrated by the bleeding and really wants it to be gone. Also ready to proceed with permanent birth control.  She does not want to continue using IUDs if possible.  Has discussed endometrial ablation with NP, Kem Boroughs, and feels this is a good option for her.  IUD is not due to be removed immediately but wants something done before it is time for a "new" one.  No LMP recorded. Patient is not currently having periods (Reason: IUD).  Sexually active:  yes  Contraception: IUD  FINDINGS: UTERUS: 8.4 x 5.2 x 5.6cm EMS: 4.37mm, IUD in correct location ADNEXA:   Left ovary 4.09 x 1.7 x 2.0cm with collapsed corpus luteal cyst 1.5cm   Right ovary 4.2 x 3.0 x 1.8cm CUL DE SAC: mod free fluid 29 x 44mm  Procedures discussed.  Feel pt should consider endometrial ablation and BTL simultaneously.  Informed pt, she could not do Essure with ablation.  Can do the Essure first and then the ablation later.  She would like to have everything done at once.    Advised of need for IUD removal and endometrial sampling but that this could be done in OR at same time as well.  She really likes the idea of having everything done at once.  Procedures discussed.  Hospital stay, recovery and pain management all discussed.  Risks discussed including but not limited to bleeding, 1% risk of receiving a  transfusion, infection, 1-2% risk of bowel/bladder/ureteral/vascular injury discussed as well as possible need for additional surgery if injury does occur discussed.  DVT/PE and rare risk of death discussed.  Uterine perforation risk discussed.  10% failure rate for endometrial ablation discussed.  Risks of BTL failure of 1/500 discussed.  My actual complications with prior surgeries discussed.  Hernia formation discussed.  Positioning and incision locations discussed.  Patient aware if endometrial biopsy pathology  abnormal she may need additional treatment.  All questions answered.  Pt ready to schedule and proceed.  Assessment:  H/O menorrhagia controlled with IUD but now with continued BTB and desire for permanent contraception  Plan: Endometrial ablation with laparoscopic BTL Endometrial biopsy and IUD removal will be done same day.  ~30 minutes spent with patient >50% of time was in face to face discussion of above.

## 2013-09-21 ENCOUNTER — Telehealth: Payer: Self-pay | Admitting: Obstetrics & Gynecology

## 2013-09-21 NOTE — Telephone Encounter (Signed)
Left message for patient to call back. Need to go over surgery benefits. °

## 2013-09-21 NOTE — Telephone Encounter (Signed)
Called patient to review benefits for surgery. Advised that patient liability will be $1176.74. Patient stated that she was told by the insurance company that the surgery would be covered at 100%. I provided the patient with the all of the details of my telephone call to the insurance company::3 D: [id number] DOB: [birth year] CPT: 58563/58671 DX: 626.2  Effective Date: 01.01.2012 Termination Date: Pre-Exisiting: n/a  Date: 03.23.2015 Time: 0832 Rep: Harrell Gave  Copay:  Deductible: $500 (20mt) OOP:$5000 ($80.84 met) Coins: 80/20 PAC: n/a Ref# [id number] (could be [phone])  Surgeon Allowed: [phone](58563= $3324.12 and 58671= $559.55) Assistant Allowed: n/a Total Allowed:: $6886.48  PR: [id number]   Patient stated that she will call her insurance back to question the information that I was provided and will call me back after speaking with them.

## 2013-09-21 NOTE — Telephone Encounter (Signed)
Patient is calling sabrina back °

## 2013-09-23 NOTE — Telephone Encounter (Signed)
Called patient. Advised that her portion for surgeons fee is [phone]. Advised that payment is due in full 2 weeks prior to the scheduled surgery date. Advised that Gay Filler would be calling to schedule.

## 2013-09-23 NOTE — Telephone Encounter (Signed)
Call to patient to check date preferences.  VM confirms name, LMTCB, no emergency.

## 2013-09-23 NOTE — Telephone Encounter (Signed)
Returning a call to Sabrina. °

## 2013-09-28 ENCOUNTER — Encounter: Payer: Self-pay | Admitting: Obstetrics & Gynecology

## 2013-10-06 ENCOUNTER — Telehealth: Payer: Self-pay | Admitting: *Deleted

## 2013-10-06 NOTE — Telephone Encounter (Signed)
Calling patient to check on date of 10-18-13, LMTCB on cell, no VM on home number.

## 2013-10-14 ENCOUNTER — Telehealth: Payer: Self-pay | Admitting: *Deleted

## 2013-10-14 NOTE — Telephone Encounter (Signed)
Follow up call to patient to alternate dates for surgery.

## 2013-10-14 NOTE — Telephone Encounter (Signed)
See next phone note.

## 2013-11-29 ENCOUNTER — Telehealth: Payer: Self-pay | Admitting: *Deleted

## 2013-11-29 NOTE — Telephone Encounter (Signed)
ROI reviewed, ok to leave message on [phone]. LM following up to see if she needs assistance with scheduling.Marland Kitchen LMTCB.

## 2013-11-30 NOTE — Telephone Encounter (Signed)
Return call to patient, she has been very busy at work but is definitely interested in help for menses.  She actually wants to know if she could proceed with hysterectomy and retain ovaries. Doesn't want to do all this to still end up needing hysterectomy in a few years. Has had this problem with cycle for years. tAdvised I will review this with Dr Sabra Heck and call her back. It may be later in week before I can get back to her and she is agreeable.  Please advise

## 2013-11-30 NOTE — Telephone Encounter (Signed)
Patient returning Sally's call. °

## 2013-11-30 NOTE — Telephone Encounter (Signed)
Would recommend LAVH with bilateral salpingectomy.  See additional note.  Thanks.

## 2013-11-30 NOTE — Telephone Encounter (Signed)
Yes, she could proceed with hysterectomy.  Has had several children and was planning on having a tubal ligation with endometrial ablation.  Also, has used and IUD with continued BTB so that method hasn't worked either.  OK to schedule.

## 2013-12-02 NOTE — Telephone Encounter (Signed)
Patient notified of Dr Ammie Ferrier information rgarding hysterectomy. Patient very pleased and would like to look at scheduling for October.  She has some questions regarding recovery. Discussed possible for light work in about 3 weeks and up to six weeks for full return to normal activity without restriction. Patient thinks she will need October. She will look into dates, Gabriel Cirri to call with updated estimate for OOP cost and then will schedule.  Gabriel Cirri,  Please precert CPT 86761, out-patient with assistant and call patient.   Encounter closed.

## 2013-12-07 NOTE — Telephone Encounter (Signed)
Spoke with patient. Advised that per benefit quote received for 58552, she will be responsible for $750.49. Also advised that payment is due in full at least 2 weeks prior to scheduled surgery date. Patient agreeable. She will be waiting to hear from [address] with dates.

## 2013-12-30 NOTE — Telephone Encounter (Signed)
Case request for 04-04-14 sent. See next encounter.  Encounter closed.

## 2014-01-03 NOTE — Telephone Encounter (Signed)
See next phone notes for additional information on procedure scheduling.  Routing to provider for final review. Patient agreeable to disposition. Will close encounter

## 2014-01-03 NOTE — Telephone Encounter (Signed)
See next phone encounter. Encounter closed.   

## 2014-01-13 ENCOUNTER — Telehealth: Payer: Self-pay

## 2014-01-13 NOTE — Telephone Encounter (Signed)
Called pt at [phone] to discuss surgery date/time and instructions.  LMOVM to call me back.

## 2014-01-28 ENCOUNTER — Other Ambulatory Visit: Payer: Self-pay | Admitting: Nurse Practitioner

## 2014-01-28 NOTE — Telephone Encounter (Signed)
Last AEX 07/23/13 Last refill 08/23/13 #4/1 refill  Please approve or deny Rx

## 2014-02-01 ENCOUNTER — Telehealth: Payer: Self-pay | Admitting: Obstetrics & Gynecology

## 2014-02-01 NOTE — Telephone Encounter (Signed)
Left message to call Kaitlyn at [phone]. 

## 2014-02-01 NOTE — Telephone Encounter (Signed)
See next phone encounter.  Routing to provider for final review. Patient agreeable to disposition. Will close encounter   

## 2014-02-01 NOTE — Telephone Encounter (Signed)
See previous phone notes. Patient has previously requested surgery for October 2015 and case is actually already scheduled for Mon 04-04-14 at 730 at [facility]. Call to patient, LMTCB.

## 2014-02-01 NOTE — Telephone Encounter (Signed)
Patient is ready to schedule surgery

## 2014-02-01 NOTE — Telephone Encounter (Signed)
Patient is requesting a refill of Diflucan. Patient is out of town and cannot come in for an appointment. If possible please call to [facility] [address], [place].  Patient will have the prescription transferred to [facility], [place].

## 2014-02-02 MED ORDER — FLUCONAZOLE 150 MG PO TABS: ORAL_TABLET | ORAL | Status: DC

## 2014-02-02 NOTE — Telephone Encounter (Signed)
Dr. Sabra Heck, patient is requesting that this message be sent directly to you or Patty.  Patient with hx of chronic yeast, last treated in January with Diflucan x 4. [place] the beach this [place] and feels that she is having yeast infection. Requests diflucan. Advised would send request to Dr. Sabra Heck, patient aware Dr. Sabra Heck not in office and patient states she will wait for her response.

## 2014-02-02 NOTE — Telephone Encounter (Signed)
Detailed message left to advise that Diflucan rx was sent in per Dr. Sabra Heck. Patient requests detailed message left on mobile.   Routing to provider for final review. Patient agreeable to disposition. Will close encounter

## 2014-02-02 NOTE — Telephone Encounter (Signed)
Gay Filler, patient returned call. She was given date for 04/04/14 at 0730 at [facility]. She is agreeable to this.  Patient needs pre-op and post op appointments scheduled but she is at the beach and cannot talk (it looks like she has appointments scheduled). Would like appointments to be scheduled and called back. Please leave detailed message on mobile voice mail per patient request.  Also, patient needs to know what her costs will be. I advised of message from [place] from 12/07/13    Spoke with patient. Advised that per benefit quote received for 58552, she will be responsible for $750.49. Also advised that payment is due in full at least 2 weeks prior to scheduled surgery date.     Gabriel Cirri, if number is different can you contact patient and leave detailed message on mobile.

## 2014-02-02 NOTE — Telephone Encounter (Signed)
OK to call in diflucan 150mg  po x 1, repeat 48 hrs if needs.  #2/0RF.

## 2014-02-25 ENCOUNTER — Telehealth: Payer: Self-pay | Admitting: *Deleted

## 2014-02-25 NOTE — Telephone Encounter (Signed)
See previous phone messages. Patient has rescheduled surgery X2. Previously on 02-02-14, and 04-04-14. Now scheduled for 05-16-14. Patient aware that if needs to reschedule again, will not be able to guarantee availability before end of year.   Routing to provider for final review. Patient agreeable to disposition. Will close encounter

## 2014-02-25 NOTE — Telephone Encounter (Signed)
Patient calling to check in with Gay Filler about scheduling the rest of her appointments.

## 2014-02-25 NOTE — Telephone Encounter (Signed)
Return call to patient. Patient is now requesting to reschedule surgery to latest possible date in this year due to work schedule. Advised end of year dates are limited already, Latest date open now is 05-16-14. Patient requests to change to this date. Advised we can reschedule but that if needs to reschedule after this, can not guarantee availability before end of year. All surgery appointments rescheduled according to new date of Nov 16-2015. Surgery instruction sheet reviewed and patient copy mailed.  Call to hospital central scheduled and case rescheduled per patient request 05-16-14 at 0730.  Routing to provider for final review. Patient agreeable to disposition. Will close encounter.

## 2014-03-11 ENCOUNTER — Telehealth: Payer: Self-pay | Admitting: Obstetrics & Gynecology

## 2014-03-11 NOTE — Telephone Encounter (Signed)
MAILED SURGERY FEE LETTER TO PATIENT::  March 11, 2014  Dear Ms. Bethanie Dicker,  Your surgery is scheduled for April 04, 2014.  Upon requesting authorization for your surgery, your insurance company has informed us that they will cover 80% of the charges after a $500 deductible, and you will be responsible to pay approximately $750.49.  It is our office policy that this amount is paid in full two weeks prior to your surgery. Your payment is due March 22, 2014.  If there is a balance due after your insurance company pays their portion, we will send you a bill.  If there is a refund due to you, we will send you a check within one month.  Payment may be made by cash, check, Visa, MasterCard or American Express.  If payment is not made two weeks prior to your surgery, we will have to reschedule your surgery.  The above fee includes only our fee for the surgery and does not include charges you may have from the facility, anesthesia or pathology.  If you have any questions, please call us at [phone].

## 2014-03-11 NOTE — Telephone Encounter (Signed)
DID NOT MAIL LETTER.Marland KitchenMarland KitchenSURGERY RESCHEDULED TO 11/16 PAYMENT DUE 11/02.

## 2014-03-25 ENCOUNTER — Ambulatory Visit: Payer: Managed Care, Other (non HMO) | Admitting: Obstetrics & Gynecology

## 2014-04-12 ENCOUNTER — Ambulatory Visit: Payer: Managed Care, Other (non HMO) | Admitting: Obstetrics & Gynecology

## 2014-04-21 ENCOUNTER — Telehealth: Payer: Self-pay | Admitting: Obstetrics & Gynecology

## 2014-04-21 NOTE — Telephone Encounter (Signed)
MAILED SURGERY PAYMENT LETTER: April 21, 2014   Dear Bethanie Dicker,  Your surgery is scheduled for May 16, 2014.  Upon requesting authorization for your surgery, your insurance company has informed us that they will cover 80% of the charges after a $500 deductible, and you will be responsible to pay approximately $750.49.  It is our office policy that this amount is paid in full two weeks prior to your surgery. Your payment is due May 02, 2014.  If there is a balance due after your insurance company pays their portion, we will send you a bill.  If there is a refund due to you, we will send you a check within one month.  Payment may be made by cash, check, Visa, MasterCard or American Express.  If payment is not made two weeks prior to your surgery, we will have to reschedule your surgery.  The above fee includes only our fee for the surgery and does not include charges you may have from the facility, anesthesia or pathology.  If you have any questions, please call us at [phone].

## 2014-04-29 ENCOUNTER — Ambulatory Visit (INDEPENDENT_AMBULATORY_CARE_PROVIDER_SITE_OTHER): Payer: Managed Care, Other (non HMO) | Admitting: Obstetrics & Gynecology

## 2014-04-29 VITALS — BP 118/80 | HR 60 | Resp 12 | Wt 144.5 lb

## 2014-04-29 DIAGNOSIS — N920 Excessive and frequent menstruation with regular cycle: Secondary | ICD-10-CM

## 2014-04-29 DIAGNOSIS — N930 Postcoital and contact bleeding: Secondary | ICD-10-CM

## 2014-04-29 DIAGNOSIS — B977 Papillomavirus as the cause of diseases classified elsewhere: Secondary | ICD-10-CM

## 2014-04-29 DIAGNOSIS — N938 Other specified abnormal uterine and vaginal bleeding: Secondary | ICD-10-CM

## 2014-04-29 MED ORDER — OXYCODONE-ACETAMINOPHEN 5-325 MG PO TABS: 2.0000 | ORAL_TABLET | ORAL | Status: DC | PRN

## 2014-04-29 MED ORDER — IBUPROFEN 800 MG PO TABS: 800.0000 mg | ORAL_TABLET | Freq: Three times a day (TID) | ORAL | Status: DC | PRN

## 2014-04-29 NOTE — Progress Notes (Signed)
38 y.o. [id number] [place] female here for discussion of upcoming procedure.  LAVH with bilateral salpingectomy planned due to history of menorrhagia.  Bleeding has been well controlled with Mirena IUD which is due to be changed.  Pt has decided she does not want to use hormonal therapy any longer.  Therefore, she doesn't want another IUD but she doesn't want her cycles to go back to the way they were before.  Also, with the IUD she has post coital bleeding with almost every episode of intercourse.  She is really tired of this.  Finally, Pap in 2/15 was neg but HR HPV was noted.  16/18 testing was negative. With all of these issues, she has decided to proceed with definitive therapy with a hysterectomy.  Pt has had four prior vaginal deliveries.  Alternatives have been discussed including IUD, OCPs, endometrial ablation.  She wants to be done with this.  Procedure discussed with patient.  Hospital stay, recovery and pain management all discussed.  Risks discussed including but not limited to bleeding, 1% risk of receiving a  transfusion, infection, 3-4% risk of bowel/bladder/ureteral/vascular injury discussed as well as possible need for additional surgery if injury does occur discussed.  DVT/PE and rare risk of death discussed.  My actual complications with prior surgeries discussed.  Vaginal cuff dehiscence discussed.  Hernia formation discussed.  Positioning and incision locations discussed.  Patient aware if pathology abnormal she may need additional treatment.  All questions answered.    Pre-op evaluation thus far has included PUS showing 8.4 x 5.2 x 5.6cm uterus without and myometrial findings.  Biopsy will be performed today.  Pt did have a normal pap with +HR HPV 2/15.  16/18 testing was negative.  Ob Hx:   No LMP recorded. Patient is not currently having periods (Reason: IUD).          Sexually active: Yes.   Birth control: IUD Last pap: 08/23/13 WNL/positive HR HPV, negative 16,18 Last  MMG: 05/18/04 right breast US Tobacco: no  Past Surgical History  Procedure Laterality Date  . Ileocolonoscopy  10/19/2007    Normal terminal ileum approximately 10-20 cm visualized/ Normal colon / Large internal hemorrhoids/ nl colon. Melanosis coli  . Cholecystectomy    . Colposcopy  7/08    pos HPV  . Intrauterine device insertion  09/2009    Mirena    Past Medical History  Diagnosis Date  . History of abnormal cervical Pap smear 2008    LGSIL    Allergies: Ceclor; Fioricet-codeine; and Stadol  Current Outpatient Prescriptions  Medication Sig Dispense Refill  . ibuprofen (ADVIL,MOTRIN) 200 MG tablet Take 800 mg by mouth every 6 (six) hours as needed. Pain      . levonorgestrel (MIRENA) 20 MCG/24HR IUD 1 each by Intrauterine route once.      . NONFORMULARY OR COMPOUNDED ITEM Boric acid suppository 200mg , size 0 gelatin capsule,1 cap 3 times weekly.  With flare use daily pv for 7 days and after SA.  30 each  12  . omeprazole (PRILOSEC) 20 MG capsule Take 20 mg by mouth daily.      . fluconazole (DIFLUCAN) 150 MG tablet 1 tablet po now and repeat in 48 hours if symptoms remain.  2 tablet  0   No current facility-administered medications for this visit.    ROS: A comprehensive review of systems was negative.  Exam:    BP 118/80  Pulse 60  Resp 12  Wt 144 lb 8 oz (65.545 kg)  General appearance: alert and cooperative Head: Normocephalic, without obvious abnormality, atraumatic Neck: no adenopathy, supple, symmetrical, trachea midline and thyroid not enlarged, symmetric, no tenderness/mass/nodules Lungs: clear to auscultation bilaterally Heart: regular rate and rhythm, S1, S2 normal, no murmur, click, rub or gallop Abdomen: soft, non-tender; bowel sounds normal; no masses,  no organomegaly Extremities: extremities normal, atraumatic, no cyanosis or edema Skin: Skin color, texture, turgor normal. No rashes or lesions Lymph nodes: Cervical, supraclavicular, and axillary  nodes normal. no inguinal nodes palpated Neurologic: Grossly normal  Pelvic: External genitalia:  no lesions              Urethra: normal appearing urethra with no masses, tenderness or lesions              Bartholins and Skenes: normal                 Vagina: normal appearing vagina with normal color and discharge, no lesions              Cervix: normal appearance              Pap taken: yes        Bimanual Exam:  Uterus:  uterus is normal size, shape, consistency and nontender                                      Adnexa:    normal adnexa in size, nontender and no masses                                      Rectovaginal: Deferred                                      Anus:  defer exam  Endometrial biopsy recommended.  Discussed with patient.  Verbal and written consent obtained.   Procedure:  Speculum placed.  Cervix visualized and cleansed with betadine prep.  A single toothed tenaculum was applied to the anterior lip of the cervix.  Endometrial pipelle was advanced through the cervix into the endometrial cavity without difficulty.  Pipelle passed to 8cm.  Suction applied and pipelle removed with good tissue sample obtained.  Tenculum removed.  No bleeding noted.  Patient tolerated procedure well.   A: H/O menorrhagia controlled with Mirena IUD use DUB and post coital bleeding with Mirena IUD H/O +HR HPV and normal pap 2/15     P:  LAVH/bilateral salpingectomy/possible BSO planned Rx for Motrin and Percocet given. Medications/Vitamins reviewed.  Pt knows needs to stop motrin and any ASA products. She will be notified with endometrial biopsy results.  Pap pending as well. Hysterectomy brochure given for pre and post op instructions.  ~30 minutes spent with patient >50% of time was in face to face discussion of above.

## 2014-04-30 ENCOUNTER — Encounter: Payer: Self-pay | Admitting: Obstetrics & Gynecology

## 2014-05-02 ENCOUNTER — Encounter: Payer: Self-pay | Admitting: Obstetrics & Gynecology

## 2014-05-03 ENCOUNTER — Ambulatory Visit: Payer: Managed Care, Other (non HMO) | Admitting: Obstetrics & Gynecology

## 2014-05-04 ENCOUNTER — Telehealth: Payer: Self-pay | Admitting: Obstetrics & Gynecology

## 2014-05-04 NOTE — Telephone Encounter (Signed)
Telephoned patient. Collected remaining surgery payment of $500.49 with [employer]isa. Mailed patient copy of receipt. Approval code [id number]

## 2014-05-04 NOTE — Telephone Encounter (Signed)
Created encounter in error

## 2014-05-05 ENCOUNTER — Telehealth: Payer: Self-pay

## 2014-05-05 LAB — IPS PAP TEST WITH REFLEX TO HPV

## 2014-05-05 NOTE — Telephone Encounter (Signed)
Lmtcb//kn 

## 2014-05-05 NOTE — Telephone Encounter (Signed)
-----   Message from Lyman Speller, MD sent at 05/04/2014  2:33 PM EST ----- Inform pt biopsy is negative.  Did send message through mychart as well.  Mrs. Glace, Just wanted you to know the endometrial biopsy was negative for abnormal cells.  Claiborne Billings, my nurse, will call to make sure you received these results.    Dr. Sabra Heck

## 2014-05-10 NOTE — Telephone Encounter (Signed)
Patient notified of biopsy results.//kn

## 2014-05-11 ENCOUNTER — Encounter (HOSPITAL_COMMUNITY): Payer: Self-pay

## 2014-05-11 ENCOUNTER — Encounter (HOSPITAL_COMMUNITY)
Admission: RE | Admit: 2014-05-11 | Discharge: 2014-05-11 | Disposition: A | Discharge status: Home / Self Care | Payer: Managed Care, Other (non HMO) | Source: Ambulatory Visit | Attending: Obstetrics & Gynecology | Admitting: Obstetrics & Gynecology

## 2014-05-11 DIAGNOSIS — Z01812 Encounter for preprocedural laboratory examination: Secondary | ICD-10-CM | POA: Insufficient documentation

## 2014-05-11 HISTORY — DX: Pneumonia, unspecified organism: J18.9

## 2014-05-11 HISTORY — DX: Headache: R51

## 2014-05-11 HISTORY — DX: Headache, unspecified: R51.9

## 2014-05-11 LAB — CBC
HCT: 36.3 % (ref 36.0–46.0)
Hemoglobin: 13 g/dL (ref 12.0–15.0)
MCH: 30.7 pg (ref 26.0–34.0)
MCHC: 35.8 g/dL (ref 30.0–36.0)
MCV: 85.8 fL (ref 78.0–100.0)
PLATELETS: 209 10*3/uL (ref 150–400)
RBC: 4.23 MIL/uL (ref 3.87–5.11)
RDW: 11.8 % (ref 11.5–15.5)
WBC: 4.7 10*3/uL (ref 4.0–10.5)

## 2014-05-11 NOTE — Patient Instructions (Signed)
Your procedure is scheduled on:05/16/14  Enter through the Main Entrance at :6am Pick up desk phone and dial [phone] and inform us of your arrival.  Please call [phone] if you have any problems the morning of surgery.  Remember: Do not eat food or drink liquids, including water, after midnight:Sunday    You may brush your teeth the morning of surgery.  DO NOT wear jewelry, eye make-up, lipstick,body lotion, or dark fingernail polish.  (Polished toes are ok) You may wear deodorant.  If you are to be admitted after surgery, leave suitcase in car until your room has been assigned. Patients discharged on the day of surgery will not be allowed to drive home. Wear loose fitting, comfortable clothes for your ride home.

## 2014-05-15 MED ORDER — METRONIDAZOLE IN NACL 5-0.79 MG/ML-% IV SOLN
500.0000 mg | INTRAVENOUS | Status: AC
  Administered 2014-05-16: 500 mg via INTRAVENOUS
  Filled 2014-05-15: qty 100

## 2014-05-15 MED ORDER — CIPROFLOXACIN IN D5W 400 MG/200ML IV SOLN
400.0000 mg | INTRAVENOUS | Status: AC
  Administered 2014-05-16: 400 mg via INTRAVENOUS
  Filled 2014-05-15: qty 200

## 2014-05-16 ENCOUNTER — Encounter (HOSPITAL_COMMUNITY)
Admission: RE | Disposition: A | Discharge status: Home / Self Care | Payer: Self-pay | Source: Ambulatory Visit | Attending: Obstetrics & Gynecology

## 2014-05-16 ENCOUNTER — Ambulatory Visit (HOSPITAL_COMMUNITY)
Admission: RE | Admit: 2014-05-16 | Discharge: 2014-05-17 | Disposition: A | Discharge status: Home / Self Care | Payer: Managed Care, Other (non HMO) | Source: Ambulatory Visit | Attending: Obstetrics & Gynecology | Admitting: Obstetrics & Gynecology

## 2014-05-16 ENCOUNTER — Ambulatory Visit (HOSPITAL_COMMUNITY): Payer: Managed Care, Other (non HMO) | Admitting: Anesthesiology

## 2014-05-16 ENCOUNTER — Encounter (HOSPITAL_COMMUNITY): Payer: Self-pay | Admitting: *Deleted

## 2014-05-16 DIAGNOSIS — N920 Excessive and frequent menstruation with regular cycle: Secondary | ICD-10-CM

## 2014-05-16 DIAGNOSIS — Z30432 Encounter for removal of intrauterine contraceptive device: Secondary | ICD-10-CM | POA: Insufficient documentation

## 2014-05-16 DIAGNOSIS — Z888 Allergy status to other drugs, medicaments and biological substances status: Secondary | ICD-10-CM | POA: Diagnosis not present

## 2014-05-16 DIAGNOSIS — D649 Anemia, unspecified: Secondary | ICD-10-CM | POA: Insufficient documentation

## 2014-05-16 DIAGNOSIS — D1803 Hemangioma of intra-abdominal structures: Secondary | ICD-10-CM

## 2014-05-16 DIAGNOSIS — N800 Endometriosis of uterus: Secondary | ICD-10-CM | POA: Diagnosis not present

## 2014-05-16 DIAGNOSIS — Z862 Personal history of diseases of the blood and blood-forming organs and certain disorders involving the immune mechanism: Secondary | ICD-10-CM

## 2014-05-16 HISTORY — PX: IUD REMOVAL: SHX5392

## 2014-05-16 HISTORY — PX: BILATERAL SALPINGECTOMY: SHX5743

## 2014-05-16 HISTORY — PX: LAPAROSCOPIC HYSTERECTOMY: SHX1926

## 2014-05-16 HISTORY — PX: CYSTOSCOPY: SHX5120

## 2014-05-16 LAB — PREGNANCY, URINE: PREG TEST UR: NEGATIVE

## 2014-05-16 SURGERY — SALPINGECTOMY, BILATERAL, OPEN
Anesthesia: General | Site: Uterus

## 2014-05-16 MED ORDER — PHENYLEPHRINE 40 MCG/ML (10ML) SYRINGE FOR IV PUSH (FOR BLOOD PRESSURE SUPPORT)
PREFILLED_SYRINGE | INTRAVENOUS | Status: AC
  Filled 2014-05-16: qty 5

## 2014-05-16 MED ORDER — ROCURONIUM BROMIDE 100 MG/10ML IV SOLN
INTRAVENOUS | Status: DC | PRN
  Administered 2014-05-16 (×2): 10 mg via INTRAVENOUS
  Administered 2014-05-16: 50 mg via INTRAVENOUS

## 2014-05-16 MED ORDER — NEOSTIGMINE METHYLSULFATE 10 MG/10ML IV SOLN
INTRAVENOUS | Status: DC | PRN
  Administered 2014-05-16: 3 mg via INTRAVENOUS

## 2014-05-16 MED ORDER — DEXAMETHASONE SODIUM PHOSPHATE 10 MG/ML IJ SOLN
INTRAMUSCULAR | Status: DC | PRN
  Administered 2014-05-16: 4 mg via INTRAVENOUS

## 2014-05-16 MED ORDER — FENTANYL CITRATE 0.05 MG/ML IJ SOLN
INTRAMUSCULAR | Status: DC | PRN
  Administered 2014-05-16: 50 ug via INTRAVENOUS
  Administered 2014-05-16: 100 ug via INTRAVENOUS
  Administered 2014-05-16 (×2): 50 ug via INTRAVENOUS

## 2014-05-16 MED ORDER — BUPIVACAINE HCL (PF) 0.25 % IJ SOLN
INTRAMUSCULAR | Status: DC | PRN
  Administered 2014-05-16: 10 mL

## 2014-05-16 MED ORDER — EPHEDRINE SULFATE 50 MG/ML IJ SOLN
INTRAMUSCULAR | Status: DC | PRN
  Administered 2014-05-16: 10 mg via INTRAVENOUS

## 2014-05-16 MED ORDER — EPHEDRINE 5 MG/ML INJ
INTRAVENOUS | Status: AC
  Filled 2014-05-16: qty 10

## 2014-05-16 MED ORDER — SIMETHICONE 80 MG PO CHEW: 80.0000 mg | CHEWABLE_TABLET | Freq: Four times a day (QID) | ORAL | Status: DC | PRN

## 2014-05-16 MED ORDER — MEPERIDINE HCL 25 MG/ML IJ SOLN: 6.2500 mg | INTRAMUSCULAR | Status: DC | PRN

## 2014-05-16 MED ORDER — ALUM & MAG HYDROXIDE-SIMETH 200-200-20 MG/5ML PO SUSP: 30.0000 mL | ORAL | Status: DC | PRN

## 2014-05-16 MED ORDER — SODIUM CHLORIDE 0.9 % IJ SOLN
INTRAMUSCULAR | Status: AC
  Filled 2014-05-16: qty 50

## 2014-05-16 MED ORDER — SCOPOLAMINE 1 MG/3DAYS TD PT72
MEDICATED_PATCH | TRANSDERMAL | Status: AC
  Administered 2014-05-16: 1.5 mg via TRANSDERMAL
  Filled 2014-05-16: qty 1

## 2014-05-16 MED ORDER — MORPHINE SULFATE 4 MG/ML IJ SOLN
2.0000 mg | INTRAMUSCULAR | Status: DC | PRN
  Administered 2014-05-16 (×2): 2 mg via INTRAVENOUS
  Filled 2014-05-16: qty 1

## 2014-05-16 MED ORDER — ONDANSETRON HCL 4 MG/2ML IJ SOLN
INTRAMUSCULAR | Status: DC | PRN
  Administered 2014-05-16 (×2): 2 mg via INTRAVENOUS

## 2014-05-16 MED ORDER — DEXAMETHASONE SODIUM PHOSPHATE 10 MG/ML IJ SOLN
INTRAMUSCULAR | Status: AC
  Filled 2014-05-16: qty 1

## 2014-05-16 MED ORDER — PROPOFOL 10 MG/ML IV EMUL
INTRAVENOUS | Status: AC
  Filled 2014-05-16: qty 20

## 2014-05-16 MED ORDER — OXYCODONE-ACETAMINOPHEN 5-325 MG PO TABS
1.0000 | ORAL_TABLET | ORAL | Status: DC | PRN
  Administered 2014-05-16 – 2014-05-17 (×2): 1 via ORAL
  Filled 2014-05-16 (×2): qty 1

## 2014-05-16 MED ORDER — KETOROLAC TROMETHAMINE 30 MG/ML IJ SOLN
INTRAMUSCULAR | Status: AC
Start: 2014-05-16 — End: 2014-05-16
  Filled 2014-05-16: qty 1

## 2014-05-16 MED ORDER — BUPIVACAINE HCL (PF) 0.25 % IJ SOLN
INTRAMUSCULAR | Status: AC
  Filled 2014-05-16: qty 30

## 2014-05-16 MED ORDER — FENTANYL CITRATE 0.05 MG/ML IJ SOLN
INTRAMUSCULAR | Status: AC
  Administered 2014-05-16: 50 ug via INTRAVENOUS
  Filled 2014-05-16: qty 2

## 2014-05-16 MED ORDER — LIDOCAINE-EPINEPHRINE 1 %-1:100000 IJ SOLN
INTRAMUSCULAR | Status: DC | PRN
  Administered 2014-05-16: 20 mL

## 2014-05-16 MED ORDER — MIDAZOLAM HCL 2 MG/2ML IJ SOLN
INTRAMUSCULAR | Status: AC
Start: 2014-05-16 — End: 2014-05-16
  Filled 2014-05-16: qty 2

## 2014-05-16 MED ORDER — SODIUM CHLORIDE 0.9 % IJ SOLN
INTRAMUSCULAR | Status: DC | PRN
  Administered 2014-05-16 (×2): 10 mL

## 2014-05-16 MED ORDER — ROCURONIUM BROMIDE 100 MG/10ML IV SOLN
INTRAVENOUS | Status: AC
Start: 2014-05-16 — End: 2014-05-16
  Filled 2014-05-16: qty 1

## 2014-05-16 MED ORDER — SODIUM CHLORIDE 0.9 % IJ SOLN
INTRAMUSCULAR | Status: AC
  Filled 2014-05-16: qty 20

## 2014-05-16 MED ORDER — LIDOCAINE-EPINEPHRINE 1 %-1:100000 IJ SOLN
INTRAMUSCULAR | Status: AC
  Filled 2014-05-16: qty 1

## 2014-05-16 MED ORDER — PHENYLEPHRINE HCL 10 MG/ML IJ SOLN
INTRAMUSCULAR | Status: DC | PRN
  Administered 2014-05-16 (×2): .08 mg via INTRAVENOUS

## 2014-05-16 MED ORDER — MORPHINE SULFATE 4 MG/ML IJ SOLN
INTRAMUSCULAR | Status: AC
  Filled 2014-05-16: qty 1

## 2014-05-16 MED ORDER — FENTANYL CITRATE 0.05 MG/ML IJ SOLN
INTRAMUSCULAR | Status: AC
Start: 2014-05-16 — End: 2014-05-16
  Filled 2014-05-16: qty 5

## 2014-05-16 MED ORDER — ONDANSETRON HCL 4 MG/2ML IJ SOLN
INTRAMUSCULAR | Status: AC
  Filled 2014-05-16: qty 2

## 2014-05-16 MED ORDER — DEXTROSE-NACL 5-0.45 % IV SOLN
INTRAVENOUS | Status: DC
  Administered 2014-05-16 – 2014-05-17 (×2): via INTRAVENOUS

## 2014-05-16 MED ORDER — LIDOCAINE HCL (CARDIAC) 20 MG/ML IV SOLN
INTRAVENOUS | Status: AC
  Filled 2014-05-16: qty 5

## 2014-05-16 MED ORDER — GLYCOPYRROLATE 0.2 MG/ML IJ SOLN
INTRAMUSCULAR | Status: DC | PRN
  Administered 2014-05-16: 0.4 mg via INTRAVENOUS
  Administered 2014-05-16 (×2): 0.1 mg via INTRAVENOUS

## 2014-05-16 MED ORDER — KETOROLAC TROMETHAMINE 30 MG/ML IJ SOLN
30.0000 mg | Freq: Four times a day (QID) | INTRAMUSCULAR | Status: DC
  Administered 2014-05-16 – 2014-05-17 (×3): 30 mg via INTRAVENOUS
  Filled 2014-05-16 (×3): qty 1

## 2014-05-16 MED ORDER — ROPIVACAINE HCL 5 MG/ML IJ SOLN
INTRAMUSCULAR | Status: AC
  Filled 2014-05-16: qty 30

## 2014-05-16 MED ORDER — MIDAZOLAM HCL 2 MG/2ML IJ SOLN: 0.5000 mg | Freq: Once | INTRAMUSCULAR | Status: DC | PRN

## 2014-05-16 MED ORDER — MIDAZOLAM HCL 2 MG/2ML IJ SOLN
INTRAMUSCULAR | Status: DC | PRN
  Administered 2014-05-16: .5 mg via INTRAVENOUS
  Administered 2014-05-16: 1.5 mg via INTRAVENOUS

## 2014-05-16 MED ORDER — MENTHOL 3 MG MT LOZG: 1.0000 | LOZENGE | OROMUCOSAL | Status: DC | PRN

## 2014-05-16 MED ORDER — SCOPOLAMINE 1 MG/3DAYS TD PT72
1.0000 | MEDICATED_PATCH | Freq: Once | TRANSDERMAL | Status: AC
  Administered 2014-05-16: 1 via TRANSDERMAL
  Administered 2014-05-16: 1.5 mg via TRANSDERMAL

## 2014-05-16 MED ORDER — PROPOFOL 10 MG/ML IV BOLUS
INTRAVENOUS | Status: DC | PRN
  Administered 2014-05-16: 200 mg via INTRAVENOUS

## 2014-05-16 MED ORDER — KETOROLAC TROMETHAMINE 30 MG/ML IJ SOLN: 30.0000 mg | Freq: Four times a day (QID) | INTRAMUSCULAR | Status: DC

## 2014-05-16 MED ORDER — LIDOCAINE HCL (CARDIAC) 20 MG/ML IV SOLN
INTRAVENOUS | Status: DC | PRN
  Administered 2014-05-16: 100 mg via INTRAVENOUS

## 2014-05-16 MED ORDER — KETOROLAC TROMETHAMINE 30 MG/ML IJ SOLN
INTRAMUSCULAR | Status: DC | PRN
  Administered 2014-05-16: 30 mg via INTRAVENOUS

## 2014-05-16 MED ORDER — KETOROLAC TROMETHAMINE 30 MG/ML IJ SOLN: 15.0000 mg | Freq: Once | INTRAMUSCULAR | Status: DC | PRN

## 2014-05-16 MED ORDER — NEOSTIGMINE METHYLSULFATE 10 MG/10ML IV SOLN
INTRAVENOUS | Status: AC
  Filled 2014-05-16: qty 1

## 2014-05-16 MED ORDER — FENTANYL CITRATE 0.05 MG/ML IJ SOLN
25.0000 ug | INTRAMUSCULAR | Status: DC | PRN
  Administered 2014-05-16: 25 ug via INTRAVENOUS
  Administered 2014-05-16: 50 ug via INTRAVENOUS

## 2014-05-16 MED ORDER — PROMETHAZINE HCL 25 MG/ML IJ SOLN: 6.2500 mg | INTRAMUSCULAR | Status: DC | PRN

## 2014-05-16 MED ORDER — GLYCOPYRROLATE 0.2 MG/ML IJ SOLN
INTRAMUSCULAR | Status: AC
  Filled 2014-05-16: qty 3

## 2014-05-16 MED ORDER — ACETAMINOPHEN 325 MG PO TABS: 650.0000 mg | ORAL_TABLET | ORAL | Status: DC | PRN

## 2014-05-16 MED ORDER — PANTOPRAZOLE SODIUM 40 MG IV SOLR
40.0000 mg | Freq: Every day | INTRAVENOUS | Status: DC
  Administered 2014-05-16: 40 mg via INTRAVENOUS
  Filled 2014-05-16: qty 40

## 2014-05-16 MED ORDER — LACTATED RINGERS IR SOLN
Status: DC | PRN
  Administered 2014-05-16: 3000 mL

## 2014-05-16 MED ORDER — LACTATED RINGERS IV SOLN
INTRAVENOUS | Status: DC
  Administered 2014-05-16 (×4): via INTRAVENOUS

## 2014-05-16 SURGICAL SUPPLY — 63 items
ADH SKN CLS APL DERMABOND .7 (GAUZE/BANDAGES/DRESSINGS) ×5
BLADE SURG 10 STRL SS (BLADE) ×7 IMPLANT
BLADE SURG 11 STRL SS (BLADE) ×14 IMPLANT
CABLE HIGH FREQUENCY MONO STRZ (ELECTRODE) ×3 IMPLANT
CLOTH BEACON ORANGE TIMEOUT ST (SAFETY) ×7 IMPLANT
CONT PATH 16OZ SNAP LID 3702 (MISCELLANEOUS) ×7 IMPLANT
COVER BACK TABLE 60X90IN (DRAPES) ×7 IMPLANT
DECANTER SPIKE VIAL GLASS SM (MISCELLANEOUS) ×6 IMPLANT
DERMABOND ADVANCED (GAUZE/BANDAGES/DRESSINGS) ×2
DERMABOND ADVANCED .7 DNX12 (GAUZE/BANDAGES/DRESSINGS) ×1 IMPLANT
DRSG COVADERM PLUS 2X2 (GAUZE/BANDAGES/DRESSINGS) ×11 IMPLANT
DRSG OPSITE POSTOP 3X4 (GAUZE/BANDAGES/DRESSINGS) ×3 IMPLANT
DURAPREP 26ML APPLICATOR (WOUND CARE) ×7 IMPLANT
ELECT REM PT RETURN 9FT ADLT (ELECTROSURGICAL) ×7
ELECTRODE REM PT RTRN 9FT ADLT (ELECTROSURGICAL) ×5 IMPLANT
EVACUATOR SMOKE 8.L (FILTER) ×7 IMPLANT
FORCEPS CUTTING 33CM 5MM (CUTTING FORCEPS) ×3 IMPLANT
GLOVE BIOGEL PI IND STRL 7.0 (GLOVE) ×20 IMPLANT
GLOVE BIOGEL PI INDICATOR 7.0 (GLOVE) ×8
GLOVE ECLIPSE 6.5 STRL STRAW (GLOVE) ×14 IMPLANT
GOWN STRL REUS W/ TWL LRG LVL3 (GOWN DISPOSABLE) ×35 IMPLANT
GOWN STRL REUS W/TWL LRG LVL3 (GOWN DISPOSABLE) ×49
LIQUID BAND (GAUZE/BANDAGES/DRESSINGS) ×7 IMPLANT
NEEDLE INSUFFLATION 120MM (ENDOMECHANICALS) ×7 IMPLANT
NS IRRIG 1000ML POUR BTL (IV SOLUTION) ×7 IMPLANT
OCCLUDER COLPOPNEUMO (BALLOONS) IMPLANT
PACK LAVH (CUSTOM PROCEDURE TRAY) ×7 IMPLANT
PAD TRENDELENBURG OR TABLE (MISCELLANEOUS) ×7 IMPLANT
PROTECTOR NERVE ULNAR (MISCELLANEOUS) ×7 IMPLANT
SCISSORS LAP 5X35 DISP (ENDOMECHANICALS) ×3 IMPLANT
SEALER TISSUE G2 CVD JAW 35 (ENDOMECHANICALS) IMPLANT
SEALER TISSUE G2 CVD JAW 45CM (ENDOMECHANICALS)
SET CYSTO W/LG BORE CLAMP LF (SET/KITS/TRAYS/PACK) ×3 IMPLANT
SET IRRIG TUBING LAPAROSCOPIC (IRRIGATION / IRRIGATOR) ×3 IMPLANT
SOLUTION ANTI FOG 6CC (MISCELLANEOUS) ×3 IMPLANT
SOLUTION ELECTROLUBE (MISCELLANEOUS) ×3 IMPLANT
SUT VIC AB 0 CT1 18XCR BRD8 (SUTURE) ×15 IMPLANT
SUT VIC AB 0 CT1 27 (SUTURE)
SUT VIC AB 0 CT1 27XBRD ANBCTR (SUTURE) IMPLANT
SUT VIC AB 0 CT1 36 (SUTURE) ×14 IMPLANT
SUT VIC AB 0 CT1 8-18 (SUTURE) ×21
SUT VIC AB 2-0 SH 27 (SUTURE)
SUT VIC AB 2-0 SH 27XBRD (SUTURE) IMPLANT
SUT VIC AB 3-0 PS2 18 (SUTURE) ×7
SUT VIC AB 3-0 PS2 18XBRD (SUTURE) ×5 IMPLANT
SUT VICRYL 0 TIES 12 18 (SUTURE) ×7 IMPLANT
SUT VICRYL 0 UR6 27IN ABS (SUTURE) ×7 IMPLANT
SUT VLOC 180 0 9IN  GS21 (SUTURE) ×2
SUT VLOC 180 0 9IN GS21 (SUTURE) ×1 IMPLANT
SYR 50ML LL SCALE MARK (SYRINGE) ×7 IMPLANT
SYR BULB IRRIGATION 50ML (SYRINGE) ×7 IMPLANT
SYRINGE 10CC LL (SYRINGE) IMPLANT
TIP UTERINE 5.1X6CM LAV DISP (MISCELLANEOUS) IMPLANT
TIP UTERINE 6.7X10CM GRN DISP (MISCELLANEOUS) IMPLANT
TIP UTERINE 6.7X6CM WHT DISP (MISCELLANEOUS) IMPLANT
TIP UTERINE 6.7X8CM BLUE DISP (MISCELLANEOUS) ×3 IMPLANT
TOWEL OR 17X24 6PK STRL BLUE (TOWEL DISPOSABLE) ×14 IMPLANT
TRAY FOLEY CATH 14FR (SET/KITS/TRAYS/PACK) ×7 IMPLANT
TROCAR BALLN 12MMX100 BLUNT (TROCAR) IMPLANT
TROCAR XCEL NON-BLD 11X100MML (ENDOMECHANICALS) ×7 IMPLANT
TROCAR XCEL NON-BLD 5MMX100MML (ENDOMECHANICALS) ×17 IMPLANT
WARMER LAPAROSCOPE (MISCELLANEOUS) ×7 IMPLANT
WATER STERILE IRR 1000ML POUR (IV SOLUTION) ×7 IMPLANT

## 2014-05-16 NOTE — H&P (Signed)
Amy Lee is an 38 y.o. female G6P3 here for definitive management of menorrhagia.  Pt has long hx of this that has actually been well controlled with a Mirena IUD.  Pt had anemia at that time.  She was more interested in conservative management at that time.  It is time, now, for IUD to be removed and she has decided to proceed with definitive management.  Pt has been seen in the office.  Alternatives have been discussed including continuing Mirena IUD.  Prior ultrasounds have shown adenomyosis so I continue to feel ablation is not the best option for her.  Risks and benefits have been discussed in the office and pt is here with her spouse and ready to proceed.  All questions addressed today.  Pertinent Gynecological History: Menses: minimal bleeding, at times Bleeding: minimal Contraception: IUD DES exposure: denies Blood transfusions: none Sexually transmitted diseases: no past history Previous GYN Procedures: none  Last mammogram: normal Date: 11/05 Last pap: normal Date: 2/15 OB History: G6, P3   Menstrual History: No LMP recorded. Patient is not currently having periods (Reason: IUD).    Past Medical History  Diagnosis Date  . History of abnormal cervical Pap smear 2008    LGSIL  . Pneumonia 2014  . Headache     Past Surgical History  Procedure Laterality Date  . Ileocolonoscopy  10/19/2007    Normal terminal ileum approximately 10-20 cm visualized/ Normal colon / Large internal hemorrhoids/ nl colon. Melanosis coli  . Cholecystectomy    . Colposcopy  7/08    pos HPV  . Intrauterine device insertion  09/2009    Mirena    Family History  Problem Relation Age of Onset  . Diabetes Mother   . Hyperlipidemia Father   . Lung cancer Maternal Grandmother   . Cancer Paternal Grandmother   . Colon cancer Paternal Grandmother   . Thyroid disease Paternal Grandmother   . Heart failure Paternal Grandfather   . Lung cancer Maternal Aunt     Social History:  reports that  she has never smoked. She has never used smokeless tobacco. She reports that she drinks alcohol. She reports that she does not use illicit drugs.  Allergies:  Allergies  Allergen Reactions  . Ceclor [Cefaclor] Rash  . Fioricet-Codeine [Butalbital-Apap-Caff-Cod] Rash  . Stadol [Butorphanol] Rash    Prescriptions prior to admission  Medication Sig Dispense Refill Last Dose  . oxyCODONE-acetaminophen (PERCOCET) 5-325 MG per tablet Take 2 tablets by mouth every 4 (four) hours as needed. use only as much as needed to relieve pain 30 tablet 0 Past Week at Unknown time  . fluconazole (DIFLUCAN) 150 MG tablet 1 tablet po now and repeat in 48 hours if symptoms remain. 2 tablet 0 Not Taking  . ibuprofen (ADVIL,MOTRIN) 800 MG tablet Take 1 tablet (800 mg total) by mouth every 8 (eight) hours as needed. 30 tablet 0   . levonorgestrel (MIRENA) 20 MCG/24HR IUD 1 each by Intrauterine route once.   Taking  . NONFORMULARY OR COMPOUNDED ITEM Boric acid suppository 200mg , size 0 gelatin capsule,1 cap 3 times weekly.  With flare use daily pv for 7 days and after SA. 30 each 12 Taking  . omeprazole (PRILOSEC) 20 MG capsule Take 20 mg by mouth daily.   Taking    Review of Systems  All other systems reviewed and are negative.   Blood pressure 125/84, pulse 90, temperature 98.1 F (36.7 C), temperature source Oral, resp. rate 20, SpO2 99 %. Physical  Exam  Constitutional: She is oriented to person, place, and time. She appears well-developed and well-nourished.  Cardiovascular: Normal rate and regular rhythm.   Respiratory: Effort normal and breath sounds normal.  Neurological: She is alert and oriented to person, place, and time.  Skin: Skin is warm and dry.  Psychiatric: She has a normal mood and affect.    Results for orders placed or performed during the hospital encounter of 05/16/14 (from the past 24 hour(s))  Pregnancy, urine     Status: None   Collection Time: 05/16/14  6:00 AM  Result Value Ref  Range   Preg Test, Ur NEGATIVE NEGATIVE    No results found.  Assessment/Plan: 38 yo G6P3 MWF here for definitive management of menorrhagia that has been well controlled with Mirena IUD but pt is now desirous of definitive management.    Amy Lee 05/16/2014, 7:08 AM

## 2014-05-16 NOTE — Plan of Care (Signed)
Problem: Phase I Progression Outcomes Goal: Dangle/OOB as tolerated per MD order Outcome: Progressing

## 2014-05-16 NOTE — Plan of Care (Signed)
Problem: Phase I Progression Outcomes Goal: I & O every 4 hrs or as ordered Outcome: Progressing

## 2014-05-16 NOTE — Progress Notes (Signed)
Day of Surgery Procedure(s) (LRB): BILATERAL SALPINGECTOMY (Bilateral) INTRAUTERINE DEVICE (IUD) REMOVAL (N/A) CYSTOSCOPY (N/A) HYSTERECTOMY TOTAL LAPAROSCOPIC (N/A)  Subjective: Pt reports no nausea.  She would like regular food.  Pain is under good control.  Biggest complaint is dry mouth.  Drinking fluids without any issues.   Objective: I have reviewed patient's vital signs, intake and output, medications and labs.  General: alert and cooperative Resp: clear to auscultation bilaterally Cardio: regular rate and rhythm, S1, S2 normal, no murmur, click, rub or gallop GI: soft, non-tender; bowel sounds normal; no masses,  no organomegaly and incision: clean, dry and intact Extremities: extremities normal, atraumatic, no cyanosis or edema Vaginal Bleeding: minimal  Assessment: s/p Procedure(s): BILATERAL SALPINGECTOMY (Bilateral) INTRAUTERINE DEVICE (IUD) REMOVAL (N/A) CYSTOSCOPY (N/A) HYSTERECTOMY TOTAL LAPAROSCOPIC (N/A): stable and progressing well  Plan: Advance diet Encourage ambulation  LOS: 0 days    Hale Bogus SUZANNE 05/16/2014, 6:01 PM

## 2014-05-16 NOTE — Anesthesia Postprocedure Evaluation (Signed)
Anesthesia Post Note  Patient: Amy Lee  Procedure(s) Performed: Procedure(s) (LRB): BILATERAL SALPINGECTOMY (Bilateral) INTRAUTERINE DEVICE (IUD) REMOVAL (N/A) CYSTOSCOPY (N/A) HYSTERECTOMY TOTAL LAPAROSCOPIC (N/A)  Anesthesia type: General  Patient location: PACU  Post pain: Pain level controlled  Post assessment: Post-op Vital signs reviewed  Last Vitals:  Filed Vitals:   05/16/14 1150  BP:   Pulse:   Temp: 36.5 C  Resp:     Post vital signs: Reviewed  Level of consciousness: sedated  Complications: No apparent anesthesia complications

## 2014-05-16 NOTE — Plan of Care (Signed)
Problem: Phase I Progression Outcomes Goal: VS, stable, temp < 100.4 degrees F Outcome: Progressing

## 2014-05-16 NOTE — Transfer of Care (Signed)
Immediate Anesthesia Transfer of Care Note  Patient: Amy Lee  Procedure(s) Performed: Procedure(s): BILATERAL SALPINGECTOMY (Bilateral) INTRAUTERINE DEVICE (IUD) REMOVAL (N/A) CYSTOSCOPY (N/A) HYSTERECTOMY TOTAL LAPAROSCOPIC (N/A)  Patient Location: PACU  Anesthesia Type:General  Level of Consciousness: awake, alert  and oriented  Airway & Oxygen Therapy: Patient Spontanous Breathing and Patient connected to nasal cannula oxygen  Post-op Assessment: Report given to PACU RN, Post -op Vital signs reviewed and stable and Patient moving all extremities X 4  Post vital signs: Reviewed and stable  Complications: No apparent anesthesia complications

## 2014-05-16 NOTE — Plan of Care (Signed)
Problem: Phase II Progression Outcomes Goal: Pain controlled on oral analgesia Outcome: Progressing

## 2014-05-16 NOTE — Anesthesia Preprocedure Evaluation (Signed)
Anesthesia Evaluation  Patient identified by MRN, date of birth, ID band Patient awake    Reviewed: Allergy & Precautions, H&P , NPO status , Patient's Chart, lab work & pertinent test results, reviewed documented beta blocker date and time   Airway Mallampati: I  TM Distance: >3 FB Neck ROM: full    Dental no notable dental hx. (+) Teeth Intact   Pulmonary    Pulmonary exam normal       Cardiovascular negative cardio ROS      Neuro/Psych negative psych ROS   GI/Hepatic negative GI ROS, Neg liver ROS,   Endo/Other  negative endocrine ROS  Renal/GU negative Renal ROS     Musculoskeletal   Abdominal Normal abdominal exam  (+)   Peds  Hematology negative hematology ROS (+)   Anesthesia Other Findings   Reproductive/Obstetrics negative OB ROS                             Anesthesia Physical Anesthesia Plan  ASA: II  Anesthesia Plan: General   Post-op Pain Management:    Induction: Intravenous  Airway Management Planned: Oral ETT  Additional Equipment:   Intra-op Plan:   Post-operative Plan: Extubation in OR  Informed Consent: I have reviewed the patients History and Physical, chart, labs and discussed the procedure including the risks, benefits and alternatives for the proposed anesthesia with the patient or authorized representative who has indicated his/her understanding and acceptance.   Dental Advisory Given  Plan Discussed with: CRNA and Surgeon  Anesthesia Plan Comments:         Anesthesia Quick Evaluation

## 2014-05-16 NOTE — Plan of Care (Signed)
Problem: Phase I Progression Outcomes Goal: Admission history reviewed Outcome: Completed/Met Date Met:  05/16/14     

## 2014-05-16 NOTE — Plan of Care (Signed)
Problem: Phase I Progression Outcomes Goal: Pain controlled with appropriate interventions Outcome: Progressing     

## 2014-05-16 NOTE — Op Note (Signed)
05/16/2014  11:56 AM  PATIENT:  Amy Lee  38 y.o. female G6P3  PRE-OPERATIVE DIAGNOSIS:  Menorrhagia, h/o anemia, adenomyosis  POST-OPERATIVE DIAGNOSIS:  same  PROCEDURE:  Procedure(s): BILATERAL SALPINGECTOMY INTRAUTERINE DEVICE (IUD) REMOVAL CYSTOSCOPY HYSTERECTOMY TOTAL LAPAROSCOPIC  SURGEON:  Watson Robarge SUZANNE  ASSISTANTS: Josefa Half   ANESTHESIA:   general  ESTIMATED BLOOD LOSS: 200cc EBL  BLOOD ADMINISTERED:none   FLUIDS: 2500cc LR  UOP: 50cc concentrated urine  SPECIMEN:  Uterus, cervix, bilateral fallopian tubes  DISPOSITION OF SPECIMEN:  PATHOLOGY  FINDINGS: Small liver vascular lesion, possible hemangioma  DESCRIPTION OF OPERATION: Patient is taken to the operating room. She is placed in the supine position. She is a running IV in place. Informed consent was present on the chart. SCDs on her lower extremities and functioning properly. General endotracheal anesthesia was administered by the anesthesia staff without difficulty. Dr. Jillyn Hidden oversaw case. Once adequate anesthesia was confirmed the legs are placed in the low lithotomy position in [facility]. Her arms were tucked by the side.   Dura prep was then used to prep the abdomen and Betadine was used to prep the inner thighs, perineum and vagina. Once 3 minutes had past the patient was draped in a normal standard fashion. The legs were lifted to the high lithotomy position. The cervix was visualized by placing a heavy weighted speculum in the posterior aspect of the vagina and using a curved Deaver retractor to the retract anteriorly. The anterior lip of the cervix was grasped with single-tooth tenaculum. The patient did have an IUD in place the strings were visualized and the IUD was removed without difficulty. It was discarded. The cervix sounded to 9 cm. Pratt dilators were used to dilate the cervix up to a #21. A RUMI uterine manipulator was obtained. A #8 disposable tip was placed on the RUMI  manipulator as well as a medium KOH ring. This was passed through the cervix and the bulb of the disposable tip was inflated with 10 cc of normal saline. There was a good fit of the KOH ring around the cervix. The tenaculum was removed. There is also good manipulation of the uterus. The speculum and retractor were removed as well. A Foley catheter was placed to straight drain. The concentrated urine was noted. Legs were lowered to the low lithotomy position and attention was turned the abdomen.  A Veress needle was obtained.   The abdomen was elevated and the needle was passed directly into the abdomen. The peritoneum was felt as a pop as it was passed with the needle. A syringe of normal saline was attached the needle and aspiration was performed. No blood or fluid was noted. Fluid was injected without difficulty and a second aspiration was performed. No fluid or blood or saline was noted. Fluid dripped easily into the needle. Low flow CO2 gas was attached the needle and the pneumoperitoneum was achieved without difficulty. Once 3.5 liters of gas was in the abdomen the Veress needle was removed and a 12 millimeter port bladed trocar were passed directly to the abdomen.  The upper abdomen could be surveyed without difficulty. Locations for three five mm ports were chosen.  Epigastric arteries were noted and skin was transilluminated.  One 2mm port was placed in the RLQ after the skin was anesthetized with 0.5% Marcaine.  Two 41mm ports were placed in the LLQ and lateral to the umbilical incision.  Again skin was anesthetized with 0.5% marcaine and skin was nicked with #11 blade.  All trochars were removed.  The table was placed on the floor and the patient was placed in Trendelenburg until the bowel slipped out of the pelvis.   Ureters were noted on each size.  Both ovaries were otherwise normal.  The uterus is normal.  Anterior and posterior cul de sacs were negative for lesions.    Attention was turned to the  left side. With uterus on stretch the left tube was excised off the ovary and mesosalpinx was dissected to free the tube. Then the left utero-ovarian pedicle was serially clamped cauterized and incised. Left round ligament was serially clamped cauterized and incised. The anterior and posterior peritoneum of the inferior leaf of the broad ligament were opened.  The bladder was taken down below the level of the KOH ring.   Attention was turned the right side and the tube was excised off the ovary using sharp dissection a bipolar cautery.  The mesosalpinx was incised freeing the tube. Then the rigth uterine ovarian pedicle was serially clamped cauterized and incised. Next the right round ligament was serially clamped cauterized and incised. The anterior posterior peritoneum of the inferiorly for the broad ligament were opened. The anterior peritoneum was carried across to the dissection on the right side. The remainder of the bladder flap was created using sharp dissection. The bladder was well below the level of the KOH ring. The right uterine artery skeletonized. Then the right uterine artery, above the level of the KOH ring, was serially clamped cauterized and incised.  Attention was turned back to the left side and the left uterine artery was serially clamped, cauterized, and incised.  The uterus was devascularized at this point.  The colpotomy was performed a starting in the midline and using monopolar cautery with an open edge of the scissors. This was carried around a circumferential fashion until the vaginal mucosa was completely incised in the specimen was freed.  The specimen was then delivered to the vagina.  A vaginal occlusive device was used to maintain the pneumoperitoneum.  There was bleeding on the right side of the cuff which was made hemostatic with bipolar cautery.  At this point, instruments were removed and attention was turned vaginally.  The cuff was closed using interrupted, figure of  eight sutures of #0 Vicryl.  Cuff was hemostatic.   Attention was turned back to the laparoscopic portion.  Pneumoperitoneum was re achieved.  The pelvis was irrigated. All pedicles were inspected. Two very small areas of bleeding were noted along the cuff which were made hemostatic with bipolar cautery.  No bleeding was noted. All pedicles were hemostatic.  Ureters were noted deep in the pelvis to be peristalsing.  At this point the procedure was completed. The instruments were removed. The patient was taken out of Trendelenburg positioning. The ports were removed under direct vision shove laparoscope and the pneumoperitoneum was relieved. Several deep breaths were given to the patient's trying to any gas the abdomen and finally the midline port was removed.  The midline port was closed at the fascial level with figure-of-eight suture of #0 Vicryl. The skin was then closed with subcuticular stitches of 3-0 Vicryl. The skin was cleansed Dermabond was applied. Attention was then turned the vagina and the cuff was inspected. No bleeding was noted. Foley was removed.  Cystoscopy was performed.  No bladder injuries or sutures were present.  Urine was seen from each ureteral orifice.  The Foley catheter replaced after the cystoscopic fluid was drained from the bladder.  Sponge, lap, needle, initially counts were correct x2. Patient tolerated the procedure very well. She was awakened from anesthesia, extubated and taken to recovery in stable condition.    COUNTS:  YES  PLAN OF CARE: Transfer to PACU

## 2014-05-17 DIAGNOSIS — N920 Excessive and frequent menstruation with regular cycle: Secondary | ICD-10-CM | POA: Diagnosis not present

## 2014-05-17 LAB — BASIC METABOLIC PANEL
Anion gap: 8 (ref 5–15)
BUN: 5 mg/dL — AB (ref 6–23)
CALCIUM: 8.4 mg/dL (ref 8.4–10.5)
CO2: 26 mEq/L (ref 19–32)
Chloride: 104 mEq/L (ref 96–112)
Creatinine, Ser: 0.78 mg/dL (ref 0.50–1.10)
GFR calc Af Amer: >90 mL/min (ref >90)
GFR calc non Af Amer: >90 mL/min (ref >90)
GLUCOSE: 127 mg/dL — AB (ref 70–99)
POTASSIUM: 3.9 meq/L (ref 3.7–5.3)
SODIUM: 138 meq/L (ref 137–147)

## 2014-05-17 LAB — CBC
HCT: 29.2 % — ABNORMAL LOW (ref 36.0–46.0)
Hemoglobin: 10.3 g/dL — ABNORMAL LOW (ref 12.0–15.0)
MCH: 30.7 pg (ref 26.0–34.0)
MCHC: 35.3 g/dL (ref 30.0–36.0)
MCV: 87.2 fL (ref 78.0–100.0)
Platelets: 181 10*3/uL (ref 150–400)
RBC: 3.35 MIL/uL — AB (ref 3.87–5.11)
RDW: 12.2 % (ref 11.5–15.5)
WBC: 9 10*3/uL (ref 4.0–10.5)

## 2014-05-17 MED ORDER — INFLUENZA VAC SPLIT QUAD 0.5 ML IM SUSY
0.5000 mL | PREFILLED_SYRINGE | INTRAMUSCULAR | Status: AC
  Administered 2014-05-17: 0.5 mL via INTRAMUSCULAR

## 2014-05-17 NOTE — Progress Notes (Signed)
Discharge teaching complete. PT understood all instructions and did not have any questions. Pt ambulated out of the hospital and discharged home to family.

## 2014-05-17 NOTE — Addendum Note (Signed)
Addendum  created 05/17/14 5670 by Brock Ra, CRNA   Modules edited: Notes Section   Notes Section:  File: [id number]

## 2014-05-17 NOTE — Plan of Care (Signed)
Problem: Phase II Progression Outcomes Goal: Pain controlled on oral analgesia Outcome: Completed/Met Date Met:  05/17/14 Goal: Progress activity as tolerated unless otherwise ordered Outcome: Completed/Met Date Met:  05/17/14 Goal: Afebrile, VS remain stable Outcome: Completed/Met Date Met:  05/17/14 Goal: Foley discontinued Outcome: Completed/Met Date Met:  05/17/14 Goal: Voiding trials/Bladder training within 48 hrs Outcome: Completed/Met Date Met:  05/17/14 Goal: Incision/dressings dry and intact Outcome: Completed/Met Date Met:  05/17/14 Goal: Remove staples if indicated/incision care Outcome: Not Applicable Date Met:  18/29/93

## 2014-05-17 NOTE — Discharge Summary (Signed)
Physician Discharge Summary  Patient ID: Amy Lee MRN: [id number] DOB/AGE: 01/22/1976 38 y.o.  Admit date: 05/16/2014 Discharge date: 05/17/2014  Admission Diagnoses:  Menorrhagia, h/o anemia, adenomyosis findings on ultrasound  Discharge Diagnoses:  Active Problems:   * No active hospital problems. *  Discharged Condition: good  Hospital Course: Patient admitted through same day surgery.  She was taken to OR where TLH/bilateral salpingectomy/cystoscopy were performed.  Surgical findings included boggy uterus and small liver lesions.  Surgery was uneventful.  EBL 200cc.  Foley catheter was left in place.  Patient transferred to PACU where she was stable and then to 3rd floor for the remainder of her hospitalization.  During her post-op recovery, her vitals and stable and she was AF.  In evening of POD#0, she was able to transition to oral pain medications and regular diet.  She was able to ambulate and she had good pain control.  Patient seen both in the evening of POD#0 and AM of POD#1.  In the AM of POD#1, she was without complaint.  Foley was removed and pt was able to void.  Abdominal ultrasound was ordered but radiology felt should be done as outpatient due to possibility pt couldn't tolerate the study.  Post op hb was 10.3, decreased from 13.9, pre-operatively.  At this point, patient was voiding, walking, having excellent pain control, had no nausea, and minimal vaginal bleeding.  She was ready for D/C.  Consults: None  Significant Diagnostic Studies: labs: post ob hb 10.3  Treatments: surgery: TLH/bilateral salpingectomy/cystoscopy  Discharge Exam: Blood pressure 110/55, pulse 88, temperature 99.1 F (37.3 C), temperature source Oral, resp. rate 16, height 5\' 5"  (1.651 m), weight 144 lb (65.318 kg), SpO2 100 %. General appearance: alert and cooperative Resp: clear to auscultation bilaterally and normal percussion bilaterally Cardio: regular rate and rhythm, S1, S2 normal, no  murmur, click, rub or gallop GI: soft, non-tender; bowel sounds normal; no masses,  no organomegaly Extremities: extremities normal, atraumatic, no cyanosis or edema Incision/Wound:c/d/i  Disposition: 01-Home or Self Care     Medication List    STOP taking these medications        fluconazole 150 MG tablet  Commonly known as:  DIFLUCAN     NONFORMULARY OR COMPOUNDED ITEM      TAKE these medications        ibuprofen 800 MG tablet  Commonly known as:  ADVIL,MOTRIN  Take 1 tablet (800 mg total) by mouth every 8 (eight) hours as needed.     omeprazole 20 MG capsule  Commonly known as:  PRILOSEC  Take 20 mg by mouth daily.     oxyCODONE-acetaminophen 5-325 MG per tablet  Commonly known as:  PERCOCET  Take 2 tablets by mouth every 4 (four) hours as needed. use only as much as needed to relieve pain           Follow-up Information    Follow up with Lyman Speller, MD On 05/23/2014.   Specialty:  Gynecology   Why:  2:30pm   Contact information:   [address] [place] [address] [phone]       Signed: Lyman Speller 05/17/2014, 10:10 AM

## 2014-05-17 NOTE — Plan of Care (Signed)
Problem: Phase I Progression Outcomes Goal: Pain controlled with appropriate interventions Outcome: Completed/Met Date Met:  05/17/14 Goal: Dangle/OOB as tolerated per MD order Outcome: Completed/Met Date Met:  05/17/14 Goal: VS, stable, temp < 100.4 degrees F Outcome: Completed/Met Date Met:  05/17/14 Goal: I & O every 4 hrs or as ordered Outcome: Completed/Met Date Met:  05/17/14 Goal: IS, TCDB as ordered Outcome: Not Applicable Date Met:  29/47/65 Goal: Other Phase I Outcomes/Goals Outcome: Not Applicable Date Met:  46/50/35

## 2014-05-17 NOTE — Plan of Care (Signed)
Problem: Phase II Progression Outcomes Goal: Other Phase II Outcomes/Goals Outcome: Completed/Met Date Met:  05/17/14  Problem: Discharge Progression Outcomes Goal: Pain controlled with appropriate interventions Outcome: Completed/Met Date Met:  05/17/14 Goal: Hemodynamically stable Outcome: Completed/Met Date Met:  05/17/14

## 2014-05-17 NOTE — Progress Notes (Signed)
1 Day Post-Op Procedure(s) (LRB): BILATERAL SALPINGECTOMY (Bilateral) INTRAUTERINE DEVICE (IUD) REMOVAL (N/A) CYSTOSCOPY (N/A) HYSTERECTOMY TOTAL LAPAROSCOPIC (N/A)  Subjective: Patient doing very well.  Has done well with regular diet.  Foley catheter is out.  Pain control better with oral pain medications.  NPO right now for RUQ u/s.  Minimal vaginal bleeding.  No flatus yet but feels gas "moving around".  Objective: I have reviewed patient's vital signs, intake and output, medications and labs.  General: alert and cooperative Resp: clear to auscultation bilaterally Cardio: regular rate and rhythm, S1, S2 normal, no murmur, click, rub or gallop GI: soft, non-tender; bowel sounds normal; no masses,  no organomegaly and incision: clean, dry and intact Extremities: extremities normal, atraumatic, no cyanosis or edema Vaginal Bleeding: minimal  Assessment: s/p Procedure(s): BILATERAL SALPINGECTOMY (Bilateral) INTRAUTERINE DEVICE (IUD) REMOVAL (N/A) CYSTOSCOPY (N/A) HYSTERECTOMY TOTAL LAPAROSCOPIC (N/A): stable and progressing well  Plan: Awaiting successful voiding  RUQ ordered and pending Will get IV out and hopeful D/C later today.  LOS: 1 day    Hale Bogus SUZANNE 05/17/2014, 7:32 AM

## 2014-05-17 NOTE — Anesthesia Postprocedure Evaluation (Signed)
  Anesthesia Post-op Note  Patient: Amy Lee  Procedure(s) Performed: Procedure(s): BILATERAL SALPINGECTOMY (Bilateral) INTRAUTERINE DEVICE (IUD) REMOVAL (N/A) CYSTOSCOPY (N/A) HYSTERECTOMY TOTAL LAPAROSCOPIC (N/A)  Patient Location: [facility]  Anesthesia Type:General  Level of Consciousness: awake, alert , oriented and patient cooperative  Airway and Oxygen Therapy: Patient Spontanous Breathing  Post-op Pain: mild  Post-op Assessment: Post-op Vital signs reviewed, Patient's Cardiovascular Status Stable, Respiratory Function Stable, Patent Airway, No signs of Nausea or vomiting, Adequate PO intake, Pain level controlled, No headache, No backache, No residual numbness and No residual motor weakness  Post-op Vital Signs: Reviewed and stable  Last Vitals:  Filed Vitals:   05/17/14 0609  BP: 110/55  Pulse: 88  Temp: 37.3 C  Resp: 16    Complications: No apparent anesthesia complications

## 2014-05-17 NOTE — Discharge Instructions (Signed)
Post Op Hysterectomy Instructions Please read the instructions below. Refer to these instructions for the next few weeks. These instructions provide you with general information on caring for yourself after surgery. Your caregiver may also give you specific instructions. While your treatment has been planned according to the most current medical practices available, unavoidable problems sometimes happen. If you have any problems or questions after you leave, please call your caregiver.  HOME CARE INSTRUCTIONS Healing will take time. You will have discomfort, tenderness, swelling and bruising at the operative site for a couple of weeks. This is normal and will get better as time goes on.   Only take over-the-counter or prescription medicines for pain, discomfort or fever as directed by your caregiver.   Do not take aspirin. It can cause bleeding.   Do not drive when taking pain medication.   Follow your caregivers advice regarding diet, exercise, lifting, driving and general activities.   Resume your usual diet as directed and allowed.   Get plenty of rest and sleep.   Do not douche, use tampons, or have sexual intercourse for eight week.  No boric acid suppositories, either.  Take your temperature if you feel hot or flushed.   You may shower today when you get home.  No tub bath for one week.    Do not drink alcohol until you are not taking any narcotic pain medications.   Try to have someone home with you for a week or two to help with the household activities.   Be careful over the next two to three weeks with any activities at home that involve lifting, pushing, or pulling.  Listen to your body--if something feels uncomfortable to do, then don't do it.  Make sure you and your family understands everything about your operation and recovery.   Walking up stairs is fine.  Do not sign any legal documents until you feel normal again.   Keep all your follow-up appointments as  recommended by your caregiver.   You can take the smaller dressing off before your shower.  You should leave the larger dressing on for three full days.  Remove it and the scopolamine patch on the same day.  PLEASE CALL THE OFFICE IF:  There is swelling, redness or increasing pain in the wound area.   Pus is coming from the wound/incision.   You notice a bad smell from the wound or surgical dressing.   You have pain, redness and swelling from the intravenous site.   The wound is breaking open (the edges are not staying together).    You develop pain or bleeding when you urinate.   You develop abnormal vaginal discharge.   You have any type of abnormal reaction or develop an allergy to your medication.   You need stronger pain medication for your pain   SEEK IMMEDIATE MEDICAL CARE:  You develop a temperature of 100.5 or higher.   You develop abdominal pain.   You develop chest pain.   You develop shortness of breath.   You pass out.   You develop pain, swelling or redness of your leg.   You develop heavy vaginal bleeding with or without blood clots.   MEDICATIONS:  Restart your regular medications BUT wait one week before restarting all vitamins and mineral supplements  Use Motrin 800mg  every 8 hours for the next several days.  This will help you use less Percocet.  Use the Percocet 5/325 1-2 tabs every 4-6 hours as needed for pain.  You may use an over the counter stool softener like Colace or Dulcolax to help with starting a bowel movement.  Start the day after you go home.  Warm liquids, fluids, and ambulation help too.  If you have not had a bowel movement in four days, you need to call the office.

## 2014-05-18 ENCOUNTER — Encounter (HOSPITAL_COMMUNITY): Payer: Self-pay | Admitting: Obstetrics & Gynecology

## 2014-05-23 ENCOUNTER — Encounter: Payer: Self-pay | Admitting: Obstetrics & Gynecology

## 2014-05-23 ENCOUNTER — Other Ambulatory Visit: Payer: Self-pay | Admitting: Obstetrics & Gynecology

## 2014-05-23 ENCOUNTER — Ambulatory Visit (INDEPENDENT_AMBULATORY_CARE_PROVIDER_SITE_OTHER): Payer: Managed Care, Other (non HMO) | Admitting: Obstetrics & Gynecology

## 2014-05-23 VITALS — BP 108/70 | HR 80 | Temp 98.6°F | Resp 18 | Ht 65.0 in | Wt 141.0 lb

## 2014-05-23 DIAGNOSIS — Z9889 Other specified postprocedural states: Secondary | ICD-10-CM

## 2014-05-23 DIAGNOSIS — N898 Other specified noninflammatory disorders of vagina: Secondary | ICD-10-CM

## 2014-05-23 DIAGNOSIS — K769 Liver disease, unspecified: Secondary | ICD-10-CM

## 2014-05-23 LAB — POCT URINALYSIS DIPSTICK
BILIRUBIN UA: NEGATIVE
GLUCOSE UA: NEGATIVE
Ketones, UA: NEGATIVE
Leukocytes, UA: NEGATIVE
Nitrite, UA: NEGATIVE
Protein, UA: NEGATIVE
RBC UA: NEGATIVE
Urobilinogen, UA: NEGATIVE
pH, UA: 5

## 2014-05-23 MED ORDER — TERCONAZOLE 0.4 % VA CREA: 1.0000 | TOPICAL_CREAM | Freq: Every day | VAGINAL | Status: DC

## 2014-05-23 MED ORDER — FLUCONAZOLE 150 MG PO TABS: 150.0000 mg | ORAL_TABLET | Freq: Once | ORAL | Status: DC

## 2014-05-23 NOTE — Progress Notes (Signed)
Unable to get MRI at [place] this week. Attempted to schedule at [facility] hospital but was instructed by their scheduling staff that [facility] requires  MRI needs to be ordered at free standing facility. MRI with and without contrast (per their requiremnt unless without contrast done within last 30 days) scheduled for Wednesday, 05-25-14 at 23 at [place], [facility] location. Fax authorization and order to [phone]. Spoke to [employer] at [phone], scheduling line.

## 2014-05-23 NOTE — Progress Notes (Signed)
Patient ID: Amy Lee, female   DOB: 29-Nov-1975, 38 y.o.   MRN: [id number]   Post Operative Visit  Procedure: Laparoscopic TLH/bilateral salpingectomy/cystoscopy Days Post-op: 7  Subjective: Doing well.  Not taking anything for pain.  No nausea.  Minimal discharge/bleeding.  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  Not lifting anything heavy.  Denies bowel and bladder symptoms.  No fevers.  Only complaint is vaginal discharge and some itching.  Reviewed pathology and pictures.  Pt knows needs imaging of liver.  Reports husband is having a job change and insurance will change end of November.  Requests to proceed with MRI now and not do u/s first.  This was recommended by radiologist reading images day of pt's surgery.  Objective: BP 108/70 mmHg  Pulse 80  Temp(Src) 98.6 F (37 C) (Oral)  Resp 18  Ht 5\' 5"  (1.651 m)  Wt 141 lb (63.957 kg)  BMI 23.46 kg/m2  EXAM General: alert and cooperative Resp: clear to auscultation bilaterally Cardio: regular rate and rhythm, S1, S2 normal, no murmur, click, rub or gallop GI: soft, non-tender; bowel sounds normal; no masses,  no organomegaly Extremities: extremities normal, atraumatic, no cyanosis or edema Vaginal Bleeding: none  Gyn: inner aspect of labia majora with erythema Inc:  C/D/I  Assessment: s/p laparoscopic TLH/bilateral salpingectomy/cystoscopy Labial irritation  Plan: Recheck 4 weeks MRI of abdomen to better quantify lesions noted at time of surgery. Terazol to inner labia BID.  Pt will call if symptoms do not resolve.

## 2014-05-24 ENCOUNTER — Telehealth: Payer: Self-pay | Admitting: Obstetrics & Gynecology

## 2014-05-24 ENCOUNTER — Telehealth: Payer: Self-pay

## 2014-05-24 NOTE — Telephone Encounter (Signed)
Picked up phone call passed from [place] regarding precert for MRI. Spoke with Nira Conn who passed phone call to [place], Therapist, sports for clinical information. MRI for liver authorized from 05/24/2014-07/20/2014. Reference number is [id number]. Patient is scheduled for MRI on Wednesday, 05-25-14 at 66 at [place], [facility] location.  Routing to provider for final review. Patient agreeable to disposition. Will close encounter

## 2014-05-24 NOTE — Telephone Encounter (Signed)
Spoke with patient. Advised that MRI of liver has been authorized. Also advised that per imaging center there is no prep prior to her procedure (no NPO). Patient agreeable.

## 2014-05-24 NOTE — Telephone Encounter (Signed)
Routing to provider for final review. Patient agreeable to disposition. Will close encounter.     

## 2014-05-25 ENCOUNTER — Telehealth: Payer: Self-pay | Admitting: *Deleted

## 2014-05-25 NOTE — Telephone Encounter (Signed)
Call to [place] at [phone]. MRI being read now per Ivin Booty and results will be faxed as soon as read. ![phone], still no results received. Dr Sabra Heck aware and recommend we update patient.  Call to patient cell number. VMbox full and unable to leave message. Call to home number. Female states patient is at work, advised we will call back on Monday, (closed for holiday weekend) nothing wrong, no info today.  Attempted to call cell again, VM box full.

## 2014-05-30 ENCOUNTER — Encounter: Payer: Self-pay | Admitting: Obstetrics & Gynecology

## 2014-05-31 ENCOUNTER — Telehealth: Payer: Self-pay

## 2014-05-31 ENCOUNTER — Encounter: Payer: Self-pay | Admitting: Obstetrics & Gynecology

## 2014-05-31 ENCOUNTER — Encounter: Payer: Self-pay | Admitting: Obstetrics and Gynecology

## 2014-05-31 ENCOUNTER — Ambulatory Visit (INDEPENDENT_AMBULATORY_CARE_PROVIDER_SITE_OTHER): Payer: Managed Care, Other (non HMO) | Admitting: Obstetrics and Gynecology

## 2014-05-31 VITALS — BP 102/62 | HR 64 | Resp 16 | Wt 141.2 lb

## 2014-05-31 DIAGNOSIS — R102 Pelvic and perineal pain: Secondary | ICD-10-CM

## 2014-05-31 DIAGNOSIS — Z9889 Other specified postprocedural states: Secondary | ICD-10-CM

## 2014-05-31 DIAGNOSIS — N939 Abnormal uterine and vaginal bleeding, unspecified: Secondary | ICD-10-CM

## 2014-05-31 DIAGNOSIS — N9489 Other specified conditions associated with female genital organs and menstrual cycle: Secondary | ICD-10-CM

## 2014-05-31 LAB — HEMOGLOBIN, FINGERSTICK: Hemoglobin, fingerstick: 12.3 g/dL (ref 12.0–16.0)

## 2014-05-31 NOTE — Progress Notes (Signed)
Patient ID: Amy Lee, female   DOB: [birth year], 38 y.o.   MRN: [id number]  GYNECOLOGY  VISIT   HPI: 38 y.o.   Married  Caucasian  female   [phone] with No LMP recorded. Patient has had a hysterectomy.   here for   Vaginal bleeding post surgery.  Status post laparoscopic hysterectomy with bilateral salpingectomy on 05/16/14.  Had vaginal closure of the vaginal cuff.  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  Had a busy day today with appointments and a closing.  Had a gush of blood into the toilet this afternoon.  Spotting now. Some lower abdominal pain today.  No pain with urination.  No pain medication use currently.  Normal bowel movements.   Asking about her MRI from last week.   Hgb 12.3.  GYNECOLOGIC HISTORY: No LMP recorded. Patient has had a hysterectomy. Contraception:    Menopausal hormone therapy:         OB History    Gravida Para Term Preterm AB TAB SAB Ectopic Multiple Living   6 5 3 2 1 1    4          There are no active problems to display for this patient.   Past Medical History  Diagnosis Date  . History of abnormal cervical Pap smear 2008    LGSIL  . Pneumonia 2014  . Headache     Past Surgical History  Procedure Laterality Date  . Ileocolonoscopy  10/19/2007    Normal terminal ileum approximately 10-20 cm visualized/ Normal colon / Large internal hemorrhoids/ nl colon. Melanosis coli  . Cholecystectomy    . Colposcopy  7/08    pos HPV  . Intrauterine device insertion  09/2009    Mirena  . Bilateral salpingectomy Bilateral 05/16/2014    Procedure: BILATERAL SALPINGECTOMY;  Surgeon: Lyman Speller, MD;  Location: [facility];  Service: Gynecology;  Laterality: Bilateral;  . Iud removal N/A 05/16/2014    Procedure: INTRAUTERINE DEVICE (IUD) REMOVAL;  Surgeon: Lyman Speller, MD;  Location: [facility];  Service: Gynecology;  Laterality: N/A;  . Cystoscopy N/A 05/16/2014    Procedure: CYSTOSCOPY;  Surgeon: Lyman Speller, MD;  Location: [facility];  Service: Gynecology;  Laterality: N/A;  . Laparoscopic hysterectomy N/A 05/16/2014    Procedure: HYSTERECTOMY TOTAL LAPAROSCOPIC;  Surgeon: Lyman Speller, MD;  Location: [facility];  Service: Gynecology;  Laterality: N/A;    Current Outpatient Prescriptions  Medication Sig Dispense Refill  . ibuprofen (ADVIL,MOTRIN) 800 MG tablet Take 1 tablet (800 mg total) by mouth every 8 (eight) hours as needed. 30 tablet 0  . fluconazole (DIFLUCAN) 150 MG tablet Take 1 tablet (150 mg total) by mouth once. Take every 72 hours for 3 doses (Patient not taking: Reported on 05/31/2014) 3 tablet 0  . omeprazole (PRILOSEC) 20 MG capsule Take 20 mg by mouth daily.    Marland Kitchen oxyCODONE-acetaminophen (PERCOCET) 5-325 MG per tablet Take 2 tablets by mouth every 4 (four) hours as needed. use only as much as needed to relieve pain (Patient not taking: Reported on 05/23/2014) 30 tablet 0  . terconazole (TERAZOL 7) 0.4 % vaginal cream Place 1 applicator vaginally at bedtime. (Patient not taking: Reported on 05/31/2014) 45 g 0   No current facility-administered medications for this visit.     ALLERGIES: Ceclor; Fioricet-codeine; and Stadol  Family History  Problem Relation Age of Onset  . Diabetes Mother   . Hyperlipidemia Father   . Lung cancer Maternal Grandmother   .  Cancer Paternal Grandmother   . Colon cancer Paternal Grandmother   . Thyroid disease Paternal Grandmother   . Heart failure Paternal Grandfather   . Lung cancer Maternal Aunt     History   Social History  . Marital Status: Married    Spouse Name: N/A    Number of Children: 4  . Years of Education: N/A   Occupational History  .     Social History Main Topics  . Smoking status: Never Smoker   . Smokeless tobacco: Never Used  . Alcohol Use: Yes     Comment: occ wine  . Drug Use: No  . Sexual Activity: Yes    Birth Control/ Protection: Surgical   Other Topics Concern  . Not on file   Social History Narrative    ROS:   Pertinent items are noted in HPI.  PHYSICAL EXAMINATION:    BP 102/62 mmHg  Pulse 64  Resp 16  Wt 141 lb 3.2 oz (64.048 kg)     General appearance: alert, cooperative and appears stated age   Abdomen: incisions intact, soft, non-tender; no masses,  no organomegaly   Pelvic: External genitalia:  no lesions              Urethra:  normal appearing urethra with no masses, tenderness or lesions              Bartholins and Skenes: normal                 Vagina:  Cuff intact.  Friable mucosal edges at the cuff treated with AgNO3.  No evidence of cellulitis.  No hematoma noted.  No bleeding coming from behind the cuff.               Cervix:  Absent.                   Bimanual Exam:  Uterus:  Absent.                                       Adnexa: No masses                                        ASSESSMENT  Vaginal cuff bleeding along mucosal edges.  Treated.  Pelvic discomfort.  PLAN  Check urine culture.  Decrease activity.  Return in 3 days for a recheck.  Return for any heavy bleeding.  Will have staff look into getting MRI report.  An After Visit Summary was printed and given to the patient.  __20____ minutes face to face time of which over 50% was spent in counseling.

## 2014-05-31 NOTE — Telephone Encounter (Signed)
Spoke with patient. Advised spoke with Dr.Silva who recommends that patient come in now to be seen in office. Dr.Silva is on her way and will see patient when she arrives. Patient is agreeable and on her way. Will be at the office by 4:40pm. Appointment scheduled.  Dr.Silva, agree with plan?

## 2014-05-31 NOTE — Telephone Encounter (Signed)
Spoke with patient at time of incoming call. Patient had Laparoscopic TLH/bilateral salpingectomy/cystoscopy on 11/16 with Dr.Miller. Patient states that she has not been doing any strenuous activity today went to the restroom at 3pm and had a "gush" of blood that filled the toilet bowl. Patient is now wearing a panti liner. "It was like someone turned a faucet it. It was so much blood and it is bright red." Patient is still currently having spotting. Has not had to change panti liner since 3:15pm. Is having lower left sided pelvic pain that is a 5/10. "I have a pretty high pain tolerance. This is just really uncomfortable. I was not in any pain before. I am really concerned and freaking out." Advised patient will need to speak with covering provider as Dr.Miller is out of the office today and return call with further instructions. Patient is agreeable.

## 2014-05-31 NOTE — Patient Instructions (Signed)
Please call for any recurrent heavy vaginal bleeding.  Reduce your activity! Take Ibuprofen for the pain.

## 2014-06-01 ENCOUNTER — Other Ambulatory Visit: Payer: Self-pay

## 2014-06-01 DIAGNOSIS — R102 Pelvic and perineal pain: Secondary | ICD-10-CM

## 2014-06-01 NOTE — Addendum Note (Signed)
Addended by: Charmayne Sheer on: 06/01/2014 11:37 AM   Modules accepted: Orders

## 2014-06-01 NOTE — Telephone Encounter (Signed)
Patient was seen by me for her appointment on 05/31/14. Encounter closed.

## 2014-06-02 ENCOUNTER — Telehealth: Payer: Self-pay | Admitting: *Deleted

## 2014-06-02 LAB — URINALYSIS, MICROSCOPIC ONLY
Bacteria, UA: NONE SEEN
CASTS: NONE SEEN
Crystals: NONE SEEN

## 2014-06-02 NOTE — Telephone Encounter (Addendum)
Call to patient with message from Dr Sabra Heck regarding results of MRI of Liver.VM box full, unable to leave message. See MRI report scanned document. Per Dr. 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 Sabra Heck reviewed results with Dr. Collene Mares. Patient scheduled for office visit tomorrow 06/03/2014 with Dr. Sabra Heck.

## 2014-06-02 NOTE — Telephone Encounter (Signed)
Call to patient. VM box remains full. Unable to leave message. Is scheduled for OV in am.  Routing to provider for final review. Patient agreeable to disposition. Will close encounter

## 2014-06-03 ENCOUNTER — Ambulatory Visit (INDEPENDENT_AMBULATORY_CARE_PROVIDER_SITE_OTHER): Payer: Managed Care, Other (non HMO) | Admitting: Obstetrics & Gynecology

## 2014-06-03 ENCOUNTER — Encounter: Payer: Self-pay | Admitting: Obstetrics & Gynecology

## 2014-06-03 VITALS — BP 122/70 | HR 88 | Resp 16 | Ht 65.0 in | Wt 138.0 lb

## 2014-06-03 DIAGNOSIS — Z9889 Other specified postprocedural states: Secondary | ICD-10-CM

## 2014-06-03 LAB — URINE CULTURE
Colony Count: NO GROWTH
Organism ID, Bacteria: NO GROWTH

## 2014-06-03 MED ORDER — SULFAMETHOXAZOLE-TRIMETHOPRIM 400-80 MG PO TABS: 1.0000 | ORAL_TABLET | Freq: Every day | ORAL | Status: DC

## 2014-06-03 NOTE — Progress Notes (Signed)
Post Operative Visit  Procedure: TLH/bilateral salpingectomy Days Post-op: 18   Subjective: Denies further vaginal bleeding.  Feeling fine.  Bowel movements normal.  Feels like emptying bladder completely.  Reviewed liver MRI with pt.  Aware many small cysts present with no follow up recommended.  Aware I also discussed this with Dr. Collene Mares.  All questions answered.    Objective: BP 122/70 mmHg  Pulse 88  Resp 16  Ht 5\' 5"  (1.651 m)  Wt 138 lb (62.596 kg)  BMI 22.96 kg/m2  EXAM General: alert and cooperative GI: soft, non-tender; bowel sounds normal; no masses,  no organomegaly and incision: clean, dry and intact Extremities: extremities normal, atraumatic, no cyanosis or edema Vaginal Bleeding: none  GYN:  NAEFG, appropriate vaginal discharge, sutures still intact, area of anterior vaginal mucosa with raw edge noted.  No fullness, appropriate tenderness.  Assessment: s/p TLH/Bilateral salpingectomy, cystoscopy  Plan: Recheck 3 weeks Pt knows I really want her to take it a little easier and to not lift anything > 10-15 pounds.  Is trying to work a little less since being seen on tues.

## 2014-06-13 ENCOUNTER — Ambulatory Visit: Payer: Managed Care, Other (non HMO) | Admitting: Obstetrics & Gynecology

## 2014-06-13 ENCOUNTER — Telehealth: Payer: Self-pay | Admitting: Obstetrics & Gynecology

## 2014-06-13 NOTE — Telephone Encounter (Signed)
Spoke with patient. Advised patient that Amy Lee would like to see her for evaluation tomorrow. Patient is agreeable. Appointment scheduled for tomorrow at 11am with [place] (time per Gay Filler). Patient is agreeable to date and time. Patient aware Dr.Miller is coming from surgery and will be called if she is stuck in OR.  Routing to provider for final review. Patient agreeable to disposition. Will close encounter

## 2014-06-13 NOTE — Telephone Encounter (Signed)
Spoke with patient. Patient had hysterectomy on 11/16 with Dr.Miller. Patient was seen on 12/01 with Dr.Silva for post op bleeding. Had follow up on 12/04 with Dr.Miller and was not having any bleeding. On 12/06 patient states that she fell down the stairs at her house and she hit her right side. After fall patient began to have "bright red" bleeding. "I took it easy for two days and made sure that I relaxed with my feet up. The bleeding slowed down to like a pink discharge. This morning I was dragging a clothes basket down the stairs and I started having bright red bleeding again. I soaked through a panti liner. I haven't been to the bathroom since because I have been so busy. After this long I should not be having any bleeding." Patient denies any pain. Patient is concerned and would like to know what to do. Advised patient will speak with Dr.Miller and return call with further recommendations and instructions. Patient is agreeable.

## 2014-06-13 NOTE — Telephone Encounter (Signed)
Pt having persistant post operative bleeding. Seen in office recently for this and is still having the issue - mainly with activity (picked up a basket of clothing this morning and bled bright red)  Please call. Pt anxious.  bf

## 2014-06-14 ENCOUNTER — Ambulatory Visit (INDEPENDENT_AMBULATORY_CARE_PROVIDER_SITE_OTHER): Payer: Managed Care, Other (non HMO) | Admitting: Obstetrics & Gynecology

## 2014-06-14 ENCOUNTER — Encounter: Payer: Self-pay | Admitting: Obstetrics & Gynecology

## 2014-06-14 VITALS — BP 108/80 | HR 68 | Resp 16 | Wt 142.2 lb

## 2014-06-14 DIAGNOSIS — Z9889 Other specified postprocedural states: Secondary | ICD-10-CM

## 2014-06-14 NOTE — Progress Notes (Signed)
Post Operative Visit  Procedure: TLH, bilateral salpingectomy Days Post-op: 29  Subjective: Pt reports that she has continued to have some off and on vaginal bleeding, depending on what she is doing.  Yesterday she drug a large and full laundry basket down a set of stairs and then had an increase in bleeding.  She is working full time with her real estate business.  She helped a friend pain over the weekend.  She is not having any help with shopping or home duties.  She does admit she isn't asking for help either.  Spouse and pt have separated.  He is moving out of home Jan 3rd.  Lots of home stressors.  Pt is having some constipation.  Hasn't tried anything.  No fevers.  Normal bladder function.  Denies fever.  Energy is, obviously, good.  Objective: BP 108/80 mmHg  Pulse 68  Resp 16  Wt 142 lb 3.2 oz (64.501 kg)  EXAM General: alert and cooperative GI: soft, non-tender; bowel sounds normal; no masses,  no organomegaly and incision: clean, dry and intact Extremities: extremities normal, atraumatic, no cyanosis or edema GYN:  NAEFG, vagina pink and moist, + discharge/blood mixture all along cuff.  No abnormal odor.  Anterior vaginal mucosa has pulled free from stitches.  No dehiscence.  Sutures still intact.  No evidence of hematoma or infection.   Assessment: s/p TLH/bilateral salpingectomy Vaginal bleeding, no active right now  Plan: Recheck 2 weeks Gave pt very specific instrucitons for SLOWING DOWN.  No lifting of anything >10#, No pulling, No pushing, No dragging.  Advised pt she cannot lift her children.  Discussed many ways to just slow down and take it easier.  Advised complete pelvic rest for at least another 4 weeks and maybe longer, depending on healing.  Pt voices clear understanding.

## 2014-06-15 ENCOUNTER — Telehealth: Payer: Self-pay | Admitting: Obstetrics & Gynecology

## 2014-06-15 NOTE — Telephone Encounter (Signed)
Faxed most recent labs for patient from 05/17/2014 labs done after 05/16/14 surgery with Dr.Miller. Labs from 05/17/14 include CBC and BMP. Also sent hemoglobin level from 05/31/14 OV with Dr.Silva. Faxed with cover sheet to [phone] attention to Alisha at the [facility]. Fax confirmation received.  Routing to provider for final review. Patient agreeable to disposition. Will close encounter

## 2014-06-15 NOTE — Telephone Encounter (Signed)
Labs printed. Sent to [facility] to fax to office

## 2014-06-15 NOTE — Telephone Encounter (Signed)
Alisha with [facility] calling requesting last labs on pt faxed to them.   States pt is there now for an appointment.  Fax: [phone]  bf

## 2014-06-21 ENCOUNTER — Telehealth: Payer: Self-pay | Admitting: Obstetrics & Gynecology

## 2014-06-21 NOTE — Telephone Encounter (Signed)
Spoke with patient. Patient states that she came in for post op appointment last week with Dr.Miller. "Two days ago I began to have a foul odor with my bleeding. Dr.Miller told me I could expect bleeding for another week or so but I can't handle this odor. It almost smells fishy. I never have this smell. I can't handle it." Patient is wearing panti liner that she changes 2 times a day. Patient is having some right lower quadrant discomfort "I always have that though. It never went away." Denies fever, chills, nausea, and vomiting. Advised patient will need to be seen for evaluation with Dr.Miller. Patient is agreeable. Patient is at work all day today until [employer]. Requests appointment tomorrow morning with Dr.Miller. Advised will need to check with provider and scheduling and return call. Patient is agreeable.

## 2014-06-21 NOTE — Telephone Encounter (Signed)
Patient would like to talk to [place]. Patient has surgery recently and now feels "something is nit right, foul odor". Patient last seen 06/14/14.

## 2014-06-21 NOTE — Telephone Encounter (Signed)
Spoke with patient. Appointment scheduled for tomorrow at 11:15am with Dr.Miller (time per Gay Filler). Patient is agreeable to date and time.  Routing to provider for final review. Patient agreeable to disposition. Will close encounter

## 2014-06-22 ENCOUNTER — Ambulatory Visit (INDEPENDENT_AMBULATORY_CARE_PROVIDER_SITE_OTHER): Payer: Managed Care, Other (non HMO) | Admitting: Obstetrics & Gynecology

## 2014-06-22 VITALS — BP 120/64 | HR 64 | Resp 12 | Wt 140.0 lb

## 2014-06-22 DIAGNOSIS — Z9889 Other specified postprocedural states: Secondary | ICD-10-CM

## 2014-06-22 MED ORDER — METRONIDAZOLE 500 MG PO TABS: 500.0000 mg | ORAL_TABLET | Freq: Two times a day (BID) | ORAL | Status: DC

## 2014-06-28 ENCOUNTER — Telehealth: Payer: Self-pay | Admitting: Obstetrics & Gynecology

## 2014-06-28 ENCOUNTER — Encounter: Payer: Self-pay | Admitting: Obstetrics & Gynecology

## 2014-06-28 ENCOUNTER — Ambulatory Visit (INDEPENDENT_AMBULATORY_CARE_PROVIDER_SITE_OTHER): Payer: Managed Care, Other (non HMO) | Admitting: Obstetrics & Gynecology

## 2014-06-28 VITALS — BP 120/80 | HR 64 | Resp 16 | Wt 140.8 lb

## 2014-06-28 DIAGNOSIS — Z9889 Other specified postprocedural states: Secondary | ICD-10-CM

## 2014-06-28 MED ORDER — METRONIDAZOLE 500 MG PO TABS: 500.0000 mg | ORAL_TABLET | Freq: Two times a day (BID) | ORAL | Status: DC

## 2014-06-28 NOTE — Telephone Encounter (Signed)
Ok to change to two weeks.

## 2014-06-28 NOTE — Telephone Encounter (Signed)
Dr.Miller patient is concerned about waiting until 07/26/14 for 3 week follow up appointment from today 06/28/14. This is next available. Okay to keep as scheduled?

## 2014-06-28 NOTE — Progress Notes (Signed)
Post Operative Visit  Procedure:TLH/bilateral salpingectomy, cystoscopy Days Post-op: 6 weeks  Subjective: Patient reports odor is much better.  Pt reports o/w she is feeling fine.  Discharge is almost gone but noted some increased bleeding/spotting today.  Not heavy.    Objective: BP 120/80 mmHg  Pulse 64  Resp 16  Wt 140 lb 12.8 oz (63.866 kg)  EXAM General: alert and cooperative GI: soft, non-tender; bowel sounds normal; no masses,  no organomegaly and incision: clean, dry and intact Extremities: extremities normal, atraumatic, no cyanosis or edema Vaginal Bleeding: minimal, sutures dissolving, scant bleeding with examination  Assessment: s/p TLH, bilateral salpingectomy, cystoscopy  Plan: Recheck 3 weeks

## 2014-06-28 NOTE — Telephone Encounter (Signed)
Patient needed to schedule her 3 week reck appointment with Dr.MIller. I scheduled patient for 07/26/13 (next available), patient is concerned this will not be soon enough due to her stiches.
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
## 2014-06-29 NOTE — Telephone Encounter (Signed)
Spoke with patient. Appointment moved to 07/11/14 at 1:30pm (time per Gay Filler). Agreeable to date and time.  Routing to provider for final review. Patient agreeable to disposition. Will close encounter

## 2014-06-30 ENCOUNTER — Telehealth: Payer: Self-pay | Admitting: Obstetrics & Gynecology

## 2014-06-30 MED ORDER — NYSTATIN 100000 UNIT/ML MT SUSP: 5.0000 mL | Freq: Four times a day (QID) | OROMUCOSAL | Status: DC

## 2014-06-30 NOTE — Telephone Encounter (Signed)
Pt says she was put on antibiotics and now has thrush on her tongue.

## 2014-06-30 NOTE — Telephone Encounter (Signed)
Spoke with patient. She states she has a painful tongue and white patches. Patient does have a history of oral thrush.  Spoke with Dr. Quincy Simmonds and receive order: Nystatin 100,000 4-6 ml use 4-6 ml po QID, retain in mouth as long as possible and spit out, use for 48 hours after symptoms resolve. Patient Has used oral nystatin in the past and understands instructions. Patient will return call to office if symptoms do not resolve.   Rx sent to [facility] in [place]. Called pharmacy and spoke with Oley Balm to ensure rx was available and to ensure pharmacist did not need clarification of rx and she states that rx was ready for patient to pick up at this time.   Routing to provider for final review. Patient agreeable to disposition. Will close encounter

## 2014-07-06 ENCOUNTER — Telehealth: Payer: Self-pay | Admitting: Obstetrics & Gynecology

## 2014-07-06 MED ORDER — FLUCONAZOLE 150 MG PO TABS: ORAL_TABLET | ORAL | Status: DC

## 2014-07-06 NOTE — Telephone Encounter (Signed)
Patient returned call. Advised order sent for her to [facility]. Advised to call back with any further concerns or if symptoms of UTI not improved after taking Cipro. Patient agreeable.   Routing to provider for final review. Patient agreeable to disposition. Will close encounter

## 2014-07-06 NOTE — Telephone Encounter (Signed)
Spoke with patient. She states she has had two days of dysuria and odor to urine. Was using AZO with relief. Patient states she has Cipro 500 mg at home and has taken first dose today. Patient states she will take Cipro 500 mg BID for 3 days and has enough of pills to last her until then. She had this Cipro on hand from a prior visit with Milford Cage, FNP. Patient denies fevers or flank pain, no chills, no nausea or vomiting.   Patient's concern now is taking Cipro and developing a yeast infection. She states "I know, If I take Cipro, I will get a yeast infection and I am just now feeling better after the flagyl". Patient also reports mouth thrush has improved. Patient is requesting diflucan to take now before follow up with Dr. Sabra Heck that is scheduled for 07/11/14 at 1:30.   Advised patient would send her request to Dr. Quincy Simmonds for treatment with Diflucan.  Patient agreeable.

## 2014-07-06 NOTE — Telephone Encounter (Signed)
Order placed.  Call to patient. Mailbox full and cannot accept any messages.

## 2014-07-06 NOTE — Telephone Encounter (Signed)
Patient calling stating, "I think I may have a UTI." She requested to speak with the nurse since, "I have Cipro already." Please advise?

## 2014-07-06 NOTE — Telephone Encounter (Signed)
Ok for Diflucan 150 mg. 2 tabs. Refills - none.

## 2014-07-11 ENCOUNTER — Ambulatory Visit (INDEPENDENT_AMBULATORY_CARE_PROVIDER_SITE_OTHER): Payer: Self-pay | Admitting: Obstetrics & Gynecology

## 2014-07-11 ENCOUNTER — Encounter: Payer: Self-pay | Admitting: Obstetrics & Gynecology

## 2014-07-11 VITALS — BP 112/62 | HR 64 | Resp 16 | Wt 138.2 lb

## 2014-07-11 DIAGNOSIS — Z9889 Other specified postprocedural states: Secondary | ICD-10-CM

## 2014-07-11 MED ORDER — ESTROGENS, CONJUGATED 0.625 MG/GM VA CREA: TOPICAL_CREAM | VAGINAL | Status: DC

## 2014-07-11 NOTE — Progress Notes (Signed)
Post Operative Visit  Procedure:TLH/bilateral salpingectomy Days Post-op: 8 weeks  Subjective: Doing well.  Vaginal discharge and odor have resolved.  No additional vaginal bleeding.  No pain.  Working full time.  Feels really good.  Objective: BP 112/62 mmHg  Pulse 64  Resp 16  Wt 138 lb 3.2 oz (62.687 kg)  EXAM General: alert and cooperative GI: soft, non-tender; bowel sounds normal; no masses,  no organomegaly and incision: clean, dry and intact Extremities: extremities normal, atraumatic, no cyanosis or edema Vaginal Bleeding: none  GYN:  Cuff well healed, sutures dissolved, one tiny area of granulation tissue just in midline at vaginal cuff  Assessment: s/p TLH/bilateral salpingectomy/cystoscopy Small area of granulation tissue at vaginal cuff  Plan: Recheck 8 weeks Premarin vaginal cream 1/2 gram pv twice weekly. Rx to pharmacy and savings card given. Cancel AEX scheduled 2/16 and plan for AEX 1 year.

## 2014-07-26 ENCOUNTER — Ambulatory Visit: Payer: Self-pay | Admitting: Obstetrics & Gynecology

## 2014-08-02 ENCOUNTER — Other Ambulatory Visit: Payer: Self-pay | Admitting: Obstetrics and Gynecology

## 2014-08-03 NOTE — Telephone Encounter (Signed)
Spoke with patient. Patient states that she went on vacation and sat in a hot tub. "Now I have a really bad yeast infection. I have white discharge, swelling, and itching." Patient took 1 diflucan on 1/29 and has used boric acid tablets x3 days with some relief but is still having symptoms. "I usually only use the boric acid after intercourse but thought I would try now to treat the yeast." Patient would like to know how she should proceed with treatment at this time with boric acid tables and is also requesting a refill of diflucan. Advised would speak with Dr.Silva and return call. Patient is agreeable.

## 2014-08-03 NOTE — Telephone Encounter (Signed)
Patient also has some questions about boric acid.

## 2014-08-03 NOTE — Telephone Encounter (Signed)
Medication refill request: Diflucan Last AEX:  08/23/13 Next AEX: 09/05/14 Last MMG (if hormonal medication request): None Refill authorized: 07/06/14 #2/0R. Today?

## 2014-08-03 NOTE — Telephone Encounter (Signed)
Patient calling to check on the status of the refill request.

## 2014-08-03 NOTE — Telephone Encounter (Signed)
Spoke with patient. Advised spoke with Dr.Silva who recommends that patient be seen for office visit for evaluation. "I already know what it is. I knew you were going to say that. I just don't have time. I have an appointment tomorrow at 8:30am. I could be there after ten. I really don't want to come all the way over there." Advised patient will need to be seen for evaluation with provider for treatment. Patient is agreeable. Appointment scheduled for tomorrow at 11:15am with Amy Lee, Guy. Patient is agreeable to date and time and to see NP.  Cc: Dr.Miller Cc: Amy Cage, FNP   I am unable to close encounter due to pending medication.

## 2014-08-04 ENCOUNTER — Encounter: Payer: Self-pay | Admitting: Nurse Practitioner

## 2014-08-04 ENCOUNTER — Ambulatory Visit (INDEPENDENT_AMBULATORY_CARE_PROVIDER_SITE_OTHER): Payer: Self-pay | Admitting: Nurse Practitioner

## 2014-08-04 VITALS — BP 110/64 | HR 66 | Ht 64.5 in | Wt 132.0 lb

## 2014-08-04 DIAGNOSIS — N760 Acute vaginitis: Secondary | ICD-10-CM

## 2014-08-04 MED ORDER — FLUCONAZOLE 150 MG PO TABS: 150.0000 mg | ORAL_TABLET | Freq: Once | ORAL | Status: DC

## 2014-08-04 NOTE — Progress Notes (Signed)
39 y.o.Divorced W Fe  G 6 P 4 here with complaint of vaginal symptoms of itching, burning, and increase discharge. Describes discharge as white and thick.  She is S/P TLH/ bilateral salpingectomy.  In December after her surgery she had BV and took Metrogel.  She then had yeast symptoms and was treated wit Diflucan.  She went away for a trip with partner and was SA several times.  She was also in a hot tub. The very next day had yeast type symptoms with onset on 07/29/14 and worse on 1/31. Took Diflucan X 1.   Also restarted Boric acid capsules on Saturday - Monday..  Symptoms are better but wants to make sure they are gone.  She is also without insurance coverage for a another few months with this job change.  She is requesting extra Diflucan to have on hand.  Denies new personal products or vaginal dryness.     NO STD concerns. Urinary symptoms none .   O:Healthy female WDWN Affect: normal, orientation x 3  Exam: no acute distress Abdomen: soft and non tender Lymph node: no enlargement or tenderness Pelvic exam: External genital:  BUS: negative Vagina: thin clear discharge noted with what appears to be remnants of Boric acid capsule.   Cervix: normal, non tender Adnexa:normal, non tender, no masses or fullness noted  Affirm test is done   A: R/O BV or yeast  S/P TLH 05/16/14   P: Discussed findings of no discharge and possible already cleared yeast vaginitis. Discussed Aveeno or baking soda sitz bath for comfort. Avoid moist clothes or pads for extended period of time. If working out in gym clothes or swim suits for long periods of time change underwear or bottoms of swimsuit if possible. Olive Oil use for skin protection prior to activity can be used to external skin.  Rx:    Refill of Diflucan to have on hand   Will call with Affirm test results.  RV prn

## 2014-08-04 NOTE — Patient Instructions (Signed)

## 2014-08-05 ENCOUNTER — Other Ambulatory Visit: Payer: Self-pay | Admitting: Nurse Practitioner

## 2014-08-05 LAB — WET PREP BY MOLECULAR PROBE
Candida species: POSITIVE — AB
Gardnerella vaginalis: POSITIVE — AB
Trichomonas vaginosis: NEGATIVE

## 2014-08-05 MED ORDER — METRONIDAZOLE 0.75 % VA GEL: 1.0000 | Freq: Every day | VAGINAL | Status: DC

## 2014-08-07 NOTE — Progress Notes (Signed)
Encounter reviewed by Dr. Brook Silva.  

## 2014-08-25 ENCOUNTER — Ambulatory Visit: Payer: Managed Care, Other (non HMO) | Admitting: Nurse Practitioner

## 2014-09-05 ENCOUNTER — Ambulatory Visit (INDEPENDENT_AMBULATORY_CARE_PROVIDER_SITE_OTHER): Payer: Self-pay | Admitting: Obstetrics & Gynecology

## 2014-09-05 ENCOUNTER — Telehealth: Payer: Self-pay | Admitting: Obstetrics & Gynecology

## 2014-09-05 DIAGNOSIS — Z9889 Other specified postprocedural states: Secondary | ICD-10-CM

## 2014-09-05 NOTE — Progress Notes (Signed)
Opened in error

## 2014-09-05 NOTE — Telephone Encounter (Signed)
Patient forgot to call and reschedule her appointment for 8 week reck. Patient says she will call later to reschedule.

## 2014-09-06 NOTE — Telephone Encounter (Signed)
Dr Sabra Heck, do you want me to put her in remind me x one week? Please advise.//kn

## 2014-09-06 NOTE — Telephone Encounter (Signed)
No.  That is fine.  She will call if she is having problems.

## 2014-09-13 NOTE — Telephone Encounter (Signed)
Okay to close encounter?  °

## 2014-09-13 NOTE — Telephone Encounter (Signed)
Yes, thank you//kn 

## 2015-02-12 ENCOUNTER — Other Ambulatory Visit: Payer: Self-pay | Admitting: Nurse Practitioner

## 2015-02-13 NOTE — Telephone Encounter (Signed)
Medication refill request: Diflucan 150 mg Last AEX:  08/23/13 with PG  Next AEX: 09/05/15 with SM  Last MMG (if hormonal medication request): n/a Refill authorized: Please advise.

## 2015-02-16 ENCOUNTER — Telehealth: Payer: Self-pay | Admitting: Obstetrics & Gynecology

## 2015-02-16 ENCOUNTER — Other Ambulatory Visit: Payer: Self-pay | Admitting: Obstetrics & Gynecology

## 2015-02-16 MED ORDER — FLUCONAZOLE 150 MG PO TABS: 150.0000 mg | ORAL_TABLET | Freq: Once | ORAL | Status: DC

## 2015-02-16 NOTE — Telephone Encounter (Signed)
Message left to return call to [place] at [phone].   Patient with history of oral thrush with antibiotic treatment.  Office visit with Milford Cage, [place] 08/04/14 and was given Diflucan 150 mg po x 2 with two refills for vaginitis.

## 2015-02-16 NOTE — Progress Notes (Signed)
Rx done for diflucan after pt phone call.  Pt has been on Amoxicillin and requests rx.

## 2015-02-16 NOTE — Telephone Encounter (Signed)
Spoke with patient. She is not currently experiencing symptoms of a yeast infection but is starting on amoxicillin today that was ordered by her Dentist. Patient requests diflucan, states "please tell Dr. Sabra Heck that I need a refill, I will get a yeast infection."   Advised would send her request to Dr. Sabra Heck and will return call with response.

## 2015-02-16 NOTE — Telephone Encounter (Signed)
Spoke with patient. Advised of message as seen below from Dr.Miller. Patient is agreeable and verbalizes understanding.  Routing to provider for final review. Patient agreeable to disposition. Will close encounter   

## 2015-02-16 NOTE — Telephone Encounter (Signed)
Rx for diflucan 150mg  po x 1, repeat 72 hrs done to pharmacy on file.  Please let pt know.

## 2015-02-16 NOTE — Telephone Encounter (Signed)
Patient returning call.

## 2015-02-16 NOTE — Telephone Encounter (Signed)
Patient wondering if she can get a refill on diflucan because she is on antibiotics.

## 2015-05-09 ENCOUNTER — Encounter: Payer: Self-pay | Admitting: Obstetrics & Gynecology

## 2015-05-10 ENCOUNTER — Telehealth: Payer: Self-pay | Admitting: Emergency Medicine

## 2015-05-10 NOTE — Telephone Encounter (Signed)
[place] message sent to patient to request return phone call to office.  Patient with history of both bacterial vaginosis and yeast.   Telephone treatment with Diflucan from Dr. Sabra Heck 02/06/15, 2 tablets with one refill.

## 2015-05-10 NOTE — Telephone Encounter (Signed)
Chief Complaint  Patient presents with  . Follow-up    Patient sent mychart message.     ===View-only below this line===   ----- Message -----    From: [name]    Sent: 05/09/2015  3:11 PM EST      To: Lyman Speller, MD Subject: Non-Urgent Medical Question  Hey there Dr Sabra Heck !  Ok... here is my situation.  I am involved in a very serious, but long distance relationship with a wonderful significant other.  If more that 7-10 days goes by without sexual contact, after we do see each other, I will get a yeast infection.... however, once I treat and if we are able to see each other at least 1 to 2 times a week, I don't have this problem. I know my pH is sensitive and I'm doing pretty good at managing the occurrence, however, I do like to keep fluconazole on hand ... Can you help me please?    Thank you !!

## 2015-05-10 NOTE — Telephone Encounter (Signed)
Telephone encounter opened.  Responded to patient via mychart. Will close mychart encounter.

## 2015-05-11 NOTE — Telephone Encounter (Signed)
Dr. Sabra Heck responded to patient via mychart. Patient has read the message. Will close encounter.

## 2015-06-12 ENCOUNTER — Telehealth: Payer: Self-pay | Admitting: Obstetrics & Gynecology

## 2015-06-12 NOTE — Telephone Encounter (Signed)
Spoke with patient at time of incoming call. Patient states that over the last 2 weeks she has had 4-5 episodes of shortness of breath and chest pain. States she feels this is related to stress. Has checked her blood pressure which is "good." Denies any sharp pain into her shoulder or arm, slurred speech,headache or difficulty walking. Is not currently experiencing chest pain or shortness of breath but did this morning. Advised she will need to seek care immediately with her PCP or local ER. Patient states her PCP is no longer with the practice she goes too and she has been seeing Dr.Miller for all of her care. Advised as a gynecology practice we are unable to perform testing she may need. Advised it is important that she seek evaluation with her PCP practice to see another MD there or be seen at a local ER. Offered to assist in scheduling with her PCP's office. Patient declines. States she will call to schedule an appointment at this time. Recommending she have someone drive her to her appointment. Will return call if she needs any assistance scheduling.   Routing to provider for final review. Patient agreeable to disposition. Will close encounter.

## 2015-06-12 NOTE — Telephone Encounter (Signed)
Patient called and said, "I am having chest pain and trouble breathing. I'd like to see Dr. Sabra Heck, if possible."

## 2015-07-10 ENCOUNTER — Telehealth: Payer: Self-pay | Admitting: Obstetrics & Gynecology

## 2015-07-10 MED ORDER — FLUCONAZOLE 150 MG PO TABS: 150.0000 mg | ORAL_TABLET | Freq: Once | ORAL | Status: DC

## 2015-07-10 NOTE — Telephone Encounter (Signed)
Patient calling back to see if diflucan will be called in for her. If not she will need to know so she can try to come in for an appointment today. She lives close to [place] so she needs to know asap.

## 2015-07-10 NOTE — Telephone Encounter (Signed)
Spoke with patient. Advised of message as seen below from [place]. Rx for Diflucan 150 mg po x 1, repeat in 48 hours if needed #2 0RF sent to pharmacy on file. Patient is agreeable.  Routing to provider for final review. Patient agreeable to disposition. Will close encounter.

## 2015-07-10 NOTE — Telephone Encounter (Signed)
Patient having yeast infection symptoms. Offered appointments with Dr Sabra Heck at 1:00 and [place] at 2:30. Patient says she live near [place] and not sure how long it would take her to get here. She is wondering if something can be called in.

## 2015-07-10 NOTE — Telephone Encounter (Signed)
OK to send in RX for diflucan 150mg  po x 1, repeat 72 hours if needed.  #2/0RF.

## 2015-07-10 NOTE — Telephone Encounter (Signed)
Patient states she is experiencing vaginal itching and discharge. Patient states she has yeast infections frequently. Patient lives close to [place] and is unsure if she will be able to make it to the office for an appointment in time due to travel conditions. Asking if Diflucan can be sent in to her pharmacy. Routing to [place] for review and advise.

## 2015-08-01 ENCOUNTER — Telehealth: Payer: Self-pay

## 2015-08-08 NOTE — Telephone Encounter (Signed)
Encounter opened in error

## 2015-08-28 ENCOUNTER — Ambulatory Visit: Payer: Self-pay | Admitting: Obstetrics & Gynecology

## 2015-08-28 ENCOUNTER — Telehealth: Payer: Self-pay | Admitting: Obstetrics & Gynecology

## 2015-08-28 NOTE — Telephone Encounter (Signed)
Patient called and cancelled her AEX with Dr. Sabra Heck today because she "forgot about it." She rescheduled for 09/19/15. FYI only.

## 2015-08-28 NOTE — Telephone Encounter (Signed)
Thanks for the update.  Ok to close encounter. 

## 2015-09-05 ENCOUNTER — Ambulatory Visit: Payer: Self-pay | Admitting: Obstetrics & Gynecology

## 2015-09-15 ENCOUNTER — Other Ambulatory Visit: Payer: Self-pay | Admitting: Obstetrics & Gynecology

## 2015-09-15 NOTE — Telephone Encounter (Signed)
This was just done in January.  I would prefer to do a test in the office and make sure this is indeed yeast before treating over the phone.  She can use OTC Monistat if desires treatment before Tuesday.

## 2015-09-15 NOTE — Telephone Encounter (Signed)
Medication refill request: Diflucan 150 mg  Last AEX:  08/23/2013 with PG  Next AEX: 09/19/2015 with SM  Last MMG (if hormonal medication request): n/a Refill authorized: Please advise.

## 2015-09-15 NOTE — Telephone Encounter (Signed)
Patient has a yeast infection and wants to see if she can have something called into the pharmacy. She states she has no refills left on her previous medication. Offered her an appointment patient declined. She is using [facility] in [place]. Patient has an appointment on Tuesday March 21st for her annual.

## 2015-09-18 NOTE — Telephone Encounter (Signed)
Called patient she said she had a diflucan in the cabinet and she will see you tomorrow for her AEX.

## 2015-09-19 ENCOUNTER — Encounter: Payer: Self-pay | Admitting: Obstetrics & Gynecology

## 2015-09-19 ENCOUNTER — Ambulatory Visit (INDEPENDENT_AMBULATORY_CARE_PROVIDER_SITE_OTHER): Payer: BLUE CROSS/BLUE SHIELD | Admitting: Obstetrics & Gynecology

## 2015-09-19 VITALS — BP 102/66 | HR 70 | Resp 16 | Ht 64.5 in | Wt 143.0 lb

## 2015-09-19 DIAGNOSIS — Z124 Encounter for screening for malignant neoplasm of cervix: Secondary | ICD-10-CM | POA: Diagnosis not present

## 2015-09-19 DIAGNOSIS — Z01419 Encounter for gynecological examination (general) (routine) without abnormal findings: Secondary | ICD-10-CM | POA: Diagnosis not present

## 2015-09-19 DIAGNOSIS — A630 Anogenital (venereal) warts: Secondary | ICD-10-CM | POA: Diagnosis not present

## 2015-09-19 DIAGNOSIS — L570 Actinic keratosis: Secondary | ICD-10-CM | POA: Diagnosis not present

## 2015-09-19 DIAGNOSIS — N9089 Other specified noninflammatory disorders of vulva and perineum: Secondary | ICD-10-CM

## 2015-09-19 MED ORDER — FLUCONAZOLE 150 MG PO TABS: 150.0000 mg | ORAL_TABLET | Freq: Every day | ORAL | Status: DC

## 2015-09-19 NOTE — Progress Notes (Signed)
40 y.o. [id number] DivorcedCaucasianF here for annual exam.  Doing well.  Having increased issues with yeast.  She is using the hydrogen peroxide and water douching.  She is dating someone new and it is a good relationship.  Denies vaginal bleeding.    Pt has three lesions she's like me to look at her on rectum, vulva and inguinal region.  Denies itching or vaginal bleeding.   Patient's last menstrual period was 07/01/2009.          Sexually active: Yes.    The current method of family planning is status post hysterectomy.    Exercising: No.  The patient does not participate in regular exercise at present. Smoker:  no  Health Maintenance: Pap:  04/29/14 Neg  History of abnormal Pap:  yes MMG:  05/18/2004 [place] Right BIRADS1:Neg Colonoscopy:  09/2007  BMD:   Never TDaP:  12/2010 Screening Labs: PCP, Urine today: PCP   reports that she has never smoked. She has never used smokeless tobacco. She reports that she drinks alcohol. She reports that she does not use illicit drugs.  Past Medical History  Diagnosis Date  . History of abnormal cervical Pap smear 2008    LGSIL  . Pneumonia 2014  . Headache     Past Surgical History  Procedure Laterality Date  . Ileocolonoscopy  10/19/2007    Normal terminal ileum approximately 10-20 cm visualized/ Normal colon / Large internal hemorrhoids/ nl colon. Melanosis coli  . Cholecystectomy    . Colposcopy  7/08    pos HPV  . Intrauterine device insertion  09/2009    Mirena  . Bilateral salpingectomy Bilateral 05/16/2014    Procedure: BILATERAL SALPINGECTOMY;  Surgeon: Lyman Speller, MD;  Location: [facility];  Service: Gynecology;  Laterality: Bilateral;  . Iud removal N/A 05/16/2014    Procedure: INTRAUTERINE DEVICE (IUD) REMOVAL;  Surgeon: Lyman Speller, MD;  Location: [facility];  Service: Gynecology;  Laterality: N/A;  . Cystoscopy N/A 05/16/2014    Procedure: CYSTOSCOPY;  Surgeon: Lyman Speller, MD;  Location: [facility];  Service:  Gynecology;  Laterality: N/A;  . Laparoscopic hysterectomy N/A 05/16/2014    Procedure: HYSTERECTOMY TOTAL LAPAROSCOPIC;  Surgeon: Lyman Speller, MD;  Location: [facility];  Service: Gynecology;  Laterality: N/A;    No current outpatient prescriptions on file.   No current facility-administered medications for this visit.    Family History  Problem Relation Age of Onset  . Diabetes Mother   . Hyperlipidemia Father   . Lung cancer Maternal Grandmother   . Cancer Paternal Grandmother   . Colon cancer Paternal Grandmother   . Thyroid disease Paternal Grandmother   . Heart failure Paternal Grandfather   . Lung cancer Maternal Aunt     ROS:  Pertinent items are noted in HPI.  Otherwise, a comprehensive ROS was negative.  Exam:   BP 102/66 mmHg  Pulse 70  Resp 16  Ht 5' 4.5" (1.638 m)  Wt 143 lb (64.864 kg)  BMI 24.18 kg/m2  LMP 07/01/2009  Weight change: +9#   Height: 5' 4.5" (163.8 cm)  Ht Readings from Last 3 Encounters:  09/19/15 5' 4.5" (1.638 m)  08/04/14 5' 4.5" (1.638 m)  06/03/14 5\' 5"  (1.651 m)    General appearance: alert, cooperative and appears stated age Head: Normocephalic, without obvious abnormality, atraumatic Neck: no adenopathy, supple, symmetrical, trachea midline and thyroid normal to inspection and palpation Lungs: clear to auscultation bilaterally Breasts: normal appearance, no masses or tenderness Heart:  regular rate and rhythm Abdomen: soft, non-tender; bowel sounds normal; no masses,  no organomegaly Extremities: extremities normal, atraumatic, no cyanosis or edema Skin: Skin color, texture, turgor normal. No rashes or lesions Lymph nodes: Cervical, supraclavicular, and axillary nodes normal. No abnormal inguinal nodes palpated Neurologic: Grossly normal   Pelvic: External genitalia:  Raised and pigmented lesions on superior aspect of mons, in left inguinal region, and raised/rough lesion in superior edge of gluteal cleft               Urethra:  normal appearing urethra with no masses, tenderness or lesions              Bartholins and Skenes: normal                 Vagina: normal appearing vagina with normal color and discharge, no lesions              Cervix: absent              Pap taken: Yes.   Bimanual Exam:  Uterus:  uterus absent              Adnexa: no mass, fullness, tenderness               Rectovaginal: Confirms               Anus:  normal sphincter tone, no lesions  Pt desires all three lesions removed.  Consent obtained.  Each lesion was cleansed with Betadine x 3, then 1cc 1% Lidocaine, plain, instilled beneath each lesion.  Lot # [id number].  Exp 08/29/16.  Lesions each grasped and excised with sterile scissors.  Silver nitrate used for hemostasis on each lesion.  Dressing applied.  Pt tolerated procedures well.  Lesions labeled and sent the pathology separately.   Chaperone was present for exam.  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 pap smear with HR HPV obtained today Diflucan rx to pharmacy.  When this is out, pt will need follow-up appt.  I would like to obtain an affirm when she is symptomatic to confirm this is indeed yeast Vulvar pathology pending.  Results will be called to pt. return annually or prn

## 2015-09-21 LAB — IPS PAP TEST WITH HPV

## 2015-09-27 ENCOUNTER — Encounter: Payer: Self-pay | Admitting: Obstetrics & Gynecology

## 2016-04-23 ENCOUNTER — Encounter: Payer: Self-pay | Admitting: Obstetrics & Gynecology

## 2016-05-28 ENCOUNTER — Encounter: Payer: Self-pay | Admitting: Obstetrics & Gynecology

## 2016-05-28 ENCOUNTER — Ambulatory Visit: Payer: Self-pay | Admitting: Obstetrics & Gynecology

## 2016-05-28 ENCOUNTER — Telehealth: Payer: Self-pay | Admitting: *Deleted

## 2016-05-28 VITALS — BP 100/62 | HR 80 | Resp 14 | Ht 65.0 in | Wt 147.2 lb

## 2016-05-28 DIAGNOSIS — F4322 Adjustment disorder with anxiety: Secondary | ICD-10-CM

## 2016-05-28 MED ORDER — TEMAZEPAM 15 MG PO CAPS: 15.0000 mg | ORAL_CAPSULE | Freq: Every evening | ORAL | 1 refills | Status: DC | PRN

## 2016-05-28 MED ORDER — ALPRAZOLAM 0.5 MG PO TABS: 0.5000 mg | ORAL_TABLET | Freq: Every evening | ORAL | 0 refills | Status: DC | PRN

## 2016-05-28 NOTE — Telephone Encounter (Signed)
Could you see if she would come at 3:45.  Will work her into the schedule.

## 2016-05-28 NOTE — Progress Notes (Signed)
GYNECOLOGY  VISIT   HPI: 40 y.o. [phone] Divorced Caucasian female here discussion of issues with anxiety that have worsened related to a recent relationship that ended, a custody battle that is going on regarding youngest child who is six, and 63 year old who recently had a blow-up with the patient and has recently moved in with her ex-husband.  49 year old is not going to school.  He is a Equities trader in high school.  There is no support from his biological father.  Son won't answer phone calls.  Just feeling really out of control with her life and this is resulting in anxiety.  Cannot see the end of these issues, either.  Denies suicidal or homicidal ideation.  Remove hx of SSRI use.  Did well with this.  Has been a long time since she's really been on anything for anxiety.  GYNECOLOGIC HISTORY: Patient's last menstrual period was 07/01/2009. Contraception: hysterectomy  Patient Active Problem List   Diagnosis Date Noted  . Post-operative state 06/14/2014    Past Medical History:  Diagnosis Date  . Headache   . History of abnormal cervical Pap smear 2008   LGSIL  . Pneumonia 2014    Past Surgical History:  Procedure Laterality Date  . BILATERAL SALPINGECTOMY Bilateral 05/16/2014   Procedure: BILATERAL SALPINGECTOMY;  Surgeon: Lyman Speller, MD;  Location: [facility];  Service: Gynecology;  Laterality: Bilateral;  . CHOLECYSTECTOMY    . COLPOSCOPY  7/08   pos HPV  . CYSTOSCOPY N/A 05/16/2014   Procedure: CYSTOSCOPY;  Surgeon: Lyman Speller, MD;  Location: [facility];  Service: Gynecology;  Laterality: N/A;  . Ileocolonoscopy  10/19/2007   Normal terminal ileum approximately 10-20 cm visualized/ Normal colon / Large internal hemorrhoids/ nl colon. Melanosis coli  . INTRAUTERINE DEVICE INSERTION  09/2009   Mirena  . IUD REMOVAL N/A 05/16/2014   Procedure: INTRAUTERINE DEVICE (IUD) REMOVAL;  Surgeon: Lyman Speller, MD;  Location: [facility];  Service: Gynecology;  Laterality:  N/A;  . LAPAROSCOPIC HYSTERECTOMY N/A 05/16/2014   Procedure: HYSTERECTOMY TOTAL LAPAROSCOPIC;  Surgeon: Lyman Speller, MD;  Location: [facility];  Service: Gynecology;  Laterality: N/A;    MEDS:  Reviewed in EPIC and UTD  ALLERGIES: Ceclor [cefaclor]; Fioricet-codeine [butalbital-apap-caff-cod]; and Stadol [butorphanol]  Family History  Problem Relation Age of Onset  . Diabetes Mother   . Hyperlipidemia Father   . Lung cancer Maternal Grandmother   . Cancer Paternal Grandmother   . Colon cancer Paternal Grandmother   . Thyroid disease Paternal Grandmother   . Heart failure Paternal Grandfather   . Lung cancer Maternal Aunt     SH:  Divorced, non smoker  Review of Systems  Psychiatric/Behavioral: Negative for depression, substance abuse and suicidal ideas. The patient is nervous/anxious.     PHYSICAL EXAMINATION:    BP 100/62 (BP Location: Right Arm, Patient Position: Sitting, Cuff Size: Normal)   Pulse 80   Resp 14   Ht 5\' 5"  (1.651 m)   Wt 147 lb 3.2 oz (66.8 kg)   LMP 07/01/2009   BMI 24.50 kg/m     General appearance: alert, cooperative and appears stated age  Assessment: Adjustment disorder with anxiety  Plan: Will start Restoril 15mg  nightly.  #30/1RF. Xanax 0.5mg  tid prn.  #30/0RF.  Pt does not want to start an SSRI at this time.  She is going to monitor how many of these she uses in a week.  If it is more than 3-4, then I  would highly advised starting SSRI.  She is in agreement with this and agrees to cal land give update in about 1 week.   ~20 minutes spent with patient >50% of time was in face to face discussion of above.

## 2016-05-28 NOTE — Telephone Encounter (Addendum)
Spoke with patient. Patient states she has no GYN concerns at this time. Patient states she is currently under a lot of stress due to custody battle with significant other and business not doing well. Patient states she feels like she is having panic attacks with chest tightness related to stress that began about 3-4 weeks ago. Patient states she would like to know if Dr. Sabra Heck will see her for this? For short term management with medication and help establish a care plan. Patient denies suicidal or homicidal ideations. Recommended urgent care/ER for further evaluation if experiencing chest pain or SHOB. Patient declines stating she has no insurance and no pcp. Advised patient I would review with Dr. Sabra Heck for any additional recommendations and return call. Advised patient Dr. Sabra Heck is seeing patients, response may not be  immediate. Patient is agreeable.  Dr. Sabra Heck please advise?    From [name] To Megan Salon, MD Sent 05/28/2016 7:40 AM  Good morning ,   I see Dr Sabra Heck at least once a year , almost as a primary care physician as I do not get sick very often and she completes my annual physical.  Anyway, I would like to schedule an appt with her as soon as possible to discuss a current health issue I am battling.   Please advise - if Dr Sabra Heck can call me , my cell is [phone]. Thanks !   Amy Lee

## 2016-05-28 NOTE — Telephone Encounter (Signed)
Spoke with patient. Appointment scheduled for today at 3:45 pm with Dr.Miller. Patient is agreeable to date and time and aware this is a work in appointment.  Routing to provider for final review. Patient agreeable to disposition. Will close encounter.

## 2016-05-30 DIAGNOSIS — F4322 Adjustment disorder with anxiety: Secondary | ICD-10-CM | POA: Insufficient documentation

## 2016-09-18 ENCOUNTER — Other Ambulatory Visit: Payer: Self-pay | Admitting: Obstetrics & Gynecology

## 2016-09-19 NOTE — Telephone Encounter (Signed)
Medication refill request: Fluconazole Last AEX:  09/19/15 SM Next AEX: 12/31/16 SM Last MMG (if hormonal medication request): n/a Refill authorized: 09/19/15 # 5 2R. Please advise. Thank you.

## 2016-09-19 NOTE — Telephone Encounter (Signed)
Please see below.  Thank you.

## 2016-09-19 NOTE — Telephone Encounter (Signed)
Patient called to check on the status of refill

## 2016-12-12 ENCOUNTER — Other Ambulatory Visit: Payer: Self-pay | Admitting: Obstetrics & Gynecology

## 2016-12-12 MED ORDER — FLUCONAZOLE 150 MG PO TABS: 150.0000 mg | ORAL_TABLET | Freq: Once | ORAL | 0 refills | Status: DC

## 2016-12-12 NOTE — Telephone Encounter (Signed)
Medication refill request: Fluconazole Last AEX:  09/19/15 SM Next AEX: 12/31/16 SM Last MMG (if hormonal medication request): n/a Refill authorized: 09/19/15 #5 2R. Patient states she is "sure that I have a yeast infection and Dr.Miller normally calls this to my pharmacy."  Please advise. Thank you.

## 2016-12-12 NOTE — Telephone Encounter (Signed)
Patient is asking for a refill of Diflucan. Patient said 'I a sure that I have a yeast infection and Dr.Miller normally calls this to my pharmacy." Confirmed pharmacy on file.

## 2016-12-31 ENCOUNTER — Encounter: Payer: Self-pay | Admitting: Obstetrics & Gynecology

## 2016-12-31 ENCOUNTER — Ambulatory Visit: Payer: BLUE CROSS/BLUE SHIELD | Admitting: Obstetrics & Gynecology

## 2016-12-31 NOTE — Progress Notes (Deleted)
41 y.o. [id number] Divorced Caucasian F here for annual exam.    Patient's last menstrual period was 07/01/2009.          Sexually active: {yes no:314532}  The current method of family planning is status post hysterectomy.    Exercising: {yes no:314532}  {types:19826} Smoker:  no  Health Maintenance: Pap:  09/19/15 ASCUS, HR HPV negative, 04/29/14 negative  History of abnormal Pap:  yes MMG:  05/18/04 BIRADS 1 negative  Colonoscopy:  09/2007  BMD:   never TDaP:  01/29/11 Pneumonia vaccine(s):  *** Zostavax:   *** Hep C testing: not indicated  Screening Labs: ***, Hb today: ***   reports that she has never smoked. She has never used smokeless tobacco. She reports that she drinks alcohol. She reports that she does not use drugs.  Past Medical History:  Diagnosis Date  . Headache   . History of abnormal cervical Pap smear 2008   LGSIL  . Pneumonia 2014    Past Surgical History:  Procedure Laterality Date  . BILATERAL SALPINGECTOMY Bilateral 05/16/2014   Procedure: BILATERAL SALPINGECTOMY;  Surgeon: Lyman Speller, MD;  Location: [facility];  Service: Gynecology;  Laterality: Bilateral;  . CHOLECYSTECTOMY    . COLPOSCOPY  7/08   pos HPV  . CYSTOSCOPY N/A 05/16/2014   Procedure: CYSTOSCOPY;  Surgeon: Lyman Speller, MD;  Location: [facility];  Service: Gynecology;  Laterality: N/A;  . Ileocolonoscopy  10/19/2007   Normal terminal ileum approximately 10-20 cm visualized/ Normal colon / Large internal hemorrhoids/ nl colon. Melanosis coli  . INTRAUTERINE DEVICE INSERTION  09/2009   Mirena  . IUD REMOVAL N/A 05/16/2014   Procedure: INTRAUTERINE DEVICE (IUD) REMOVAL;  Surgeon: Lyman Speller, MD;  Location: [facility];  Service: Gynecology;  Laterality: N/A;  . LAPAROSCOPIC HYSTERECTOMY N/A 05/16/2014   Procedure: HYSTERECTOMY TOTAL LAPAROSCOPIC;  Surgeon: Lyman Speller, MD;  Location: [facility];  Service: Gynecology;  Laterality: N/A;    Current Outpatient Prescriptions   Medication Sig Dispense Refill  . ALPRAZolam (XANAX) 0.5 MG tablet Take 1 tablet (0.5 mg total) by mouth at bedtime as needed for anxiety. 30 tablet 0  . temazepam (RESTORIL) 15 MG capsule Take 1 capsule (15 mg total) by mouth at bedtime as needed for sleep. 30 capsule 1   No current facility-administered medications for this visit.     Family History  Problem Relation Age of Onset  . Diabetes Mother   . Hyperlipidemia Father   . Lung cancer Maternal Grandmother   . Cancer Paternal Grandmother   . Colon cancer Paternal Grandmother   . Thyroid disease Paternal Grandmother   . Heart failure Paternal Grandfather   . Lung cancer Maternal Aunt     ROS:  Pertinent items are noted in HPI.  Otherwise, a comprehensive ROS was negative.  Exam:   LMP 07/01/2009   Weight change: @WEIGHTCHANGE @ Height:      Ht Readings from Last 3 Encounters:  05/28/16 5\' 5"  (1.651 m)  09/19/15 5' 4.5" (1.638 m)  08/04/14 5' 4.5" (1.638 m)    General appearance: alert, cooperative and appears stated age Head: Normocephalic, without obvious abnormality, atraumatic Neck: no adenopathy, supple, symmetrical, trachea midline and thyroid {EXAM; THYROID:18604} Lungs: clear to auscultation bilaterally Breasts: {Exam; breast:13139::"normal appearance, no masses or tenderness"} Heart: regular rate and rhythm Abdomen: soft, non-tender; bowel sounds normal; no masses,  no organomegaly Extremities: extremities normal, atraumatic, no cyanosis or edema Skin: Skin color, texture, turgor normal. No rashes or lesions Lymph  nodes: Cervical, supraclavicular, and axillary nodes normal. No abnormal inguinal nodes palpated Neurologic: Grossly normal   Pelvic: External genitalia:  no lesions              Urethra:  normal appearing urethra with no masses, tenderness or lesions              Bartholins and Skenes: normal                 Vagina: normal appearing vagina with normal color and discharge, no lesions               Cervix: {exam; cervix:14595}              Pap taken: {yes no:314532} Bimanual Exam:  Uterus:  {exam; uterus:12215}              Adnexa: {exam; adnexa:12223}               Rectovaginal: Confirms               Anus:  normal sphincter tone, no lesions  Chaperone was present for exam.  A:  Well Woman with normal exam  P:   {plan; gyn:5269::"mammogram","pap smear","return annually or prn"}

## 2017-01-06 ENCOUNTER — Telehealth: Payer: Self-pay | Admitting: *Deleted

## 2017-01-06 NOTE — Telephone Encounter (Signed)
-----   Message from Megan Salon, MD sent at 01/01/2017  5:14 AM EDT ----- Regarding: missed AEX Pt missed her AEX on 12/31/16.  She had a hysterectomy about two years ago.  She has hx of +HR HPV on pap smear in 2015.  Pap last year was ASCUS with neg HR HPV.  She needs repeat Pap and HR HPV.  She is in an 08 recall.  Can you call her please?  Thanks.  Vinnie Level

## 2017-01-06 NOTE — Telephone Encounter (Signed)
Left message to call Elenora Hawbaker at [phone].  

## 2017-01-21 NOTE — Telephone Encounter (Signed)
Spoke with patient, advised as seen below per Dr. Sabra Heck. Patient states she has additional questions about billing prior to scheduling. Advised patient would forward message to office administrator for return call. Patient is agreeable.   Forwarding to Thayer Ohm  Cc: Dr. Sabra Heck

## 2017-01-22 NOTE — Telephone Encounter (Signed)
Returned call to patient. Offered appointment for AEX 01/24/17, but patient declined. She will call back to reschedule once she has better cell reception. See account notes for billing information.   Routing to provider for final review. Patient agreeable to disposition. Will close encounter.

## 2017-03-05 ENCOUNTER — Telehealth: Payer: Self-pay | Admitting: *Deleted

## 2017-03-05 NOTE — Telephone Encounter (Signed)
error 

## 2017-03-27 ENCOUNTER — Other Ambulatory Visit: Payer: Self-pay | Admitting: Obstetrics & Gynecology

## 2017-04-02 NOTE — Progress Notes (Deleted)
41 y.o. [id number] Divorced Caucasian F here for annual exam.    Patient's last menstrual period was 07/01/2009.          Sexually active: {yes no:314532}  The current method of family planning is status post hysterectomy.    Exercising: {yes no:314532}  {types:19826} Smoker:  no  Health Maintenance: Pap:  09/19/15 ASCUS, HR HPV, 04/29/14 negative, 08/23/13 negative, HR HPV positive- 16/18/45 negative  History of abnormal Pap:  yes MMG:  05/18/04 [place] Right Breast- BIRADS 1 negative  Colonoscopy:  09/2007  BMD:   *** TDaP:  01/29/11  Pneumonia vaccine(s):  *** Zostavax:   *** Hep C testing: not indicated  Screening Labs: ***, Hb today: ***, Urine today: ***   reports that she has never smoked. She has never used smokeless tobacco. She reports that she drinks alcohol. She reports that she does not use drugs.  Past Medical History:  Diagnosis Date  . Headache   . History of abnormal cervical Pap smear 2008   LGSIL  . Pneumonia 2014    Past Surgical History:  Procedure Laterality Date  . BILATERAL SALPINGECTOMY Bilateral 05/16/2014   Procedure: BILATERAL SALPINGECTOMY;  Surgeon: Lyman Speller, MD;  Location: [facility];  Service: Gynecology;  Laterality: Bilateral;  . CHOLECYSTECTOMY    . COLPOSCOPY  7/08   pos HPV  . CYSTOSCOPY N/A 05/16/2014   Procedure: CYSTOSCOPY;  Surgeon: Lyman Speller, MD;  Location: [facility];  Service: Gynecology;  Laterality: N/A;  . Ileocolonoscopy  10/19/2007   Normal terminal ileum approximately 10-20 cm visualized/ Normal colon / Large internal hemorrhoids/ nl colon. Melanosis coli  . INTRAUTERINE DEVICE INSERTION  09/2009   Mirena  . IUD REMOVAL N/A 05/16/2014   Procedure: INTRAUTERINE DEVICE (IUD) REMOVAL;  Surgeon: Lyman Speller, MD;  Location: [facility];  Service: Gynecology;  Laterality: N/A;  . LAPAROSCOPIC HYSTERECTOMY N/A 05/16/2014   Procedure: HYSTERECTOMY TOTAL LAPAROSCOPIC;  Surgeon: Lyman Speller, MD;  Location: [facility];   Service: Gynecology;  Laterality: N/A;    Current Outpatient Prescriptions  Medication Sig Dispense Refill  . ALPRAZolam (XANAX) 0.5 MG tablet Take 1 tablet (0.5 mg total) by mouth at bedtime as needed for anxiety. 30 tablet 0  . temazepam (RESTORIL) 15 MG capsule Take 1 capsule (15 mg total) by mouth at bedtime as needed for sleep. 30 capsule 1   No current facility-administered medications for this visit.     Family History  Problem Relation Age of Onset  . Diabetes Mother   . Hyperlipidemia Father   . Lung cancer Maternal Grandmother   . Cancer Paternal Grandmother   . Colon cancer Paternal Grandmother   . Thyroid disease Paternal Grandmother   . Heart failure Paternal Grandfather   . Lung cancer Maternal Aunt     ROS:  Pertinent items are noted in HPI.  Otherwise, a comprehensive ROS was negative.  Exam:   LMP 07/01/2009   Weight change: @WEIGHTCHANGE @ Height:      Ht Readings from Last 3 Encounters:  05/28/16 5\' 5"  (1.651 m)  09/19/15 5' 4.5" (1.638 m)  08/04/14 5' 4.5" (1.638 m)    General appearance: alert, cooperative and appears stated age Head: Normocephalic, without obvious abnormality, atraumatic Neck: no adenopathy, supple, symmetrical, trachea midline and thyroid {EXAM; THYROID:18604} Lungs: clear to auscultation bilaterally Breasts: {Exam; breast:13139::"normal appearance, no masses or tenderness"} Heart: regular rate and rhythm Abdomen: soft, non-tender; bowel sounds normal; no masses,  no organomegaly Extremities: extremities normal, atraumatic, no cyanosis  or edema Skin: Skin color, texture, turgor normal. No rashes or lesions Lymph nodes: Cervical, supraclavicular, and axillary nodes normal. No abnormal inguinal nodes palpated Neurologic: Grossly normal   Pelvic: External genitalia:  no lesions              Urethra:  normal appearing urethra with no masses, tenderness or lesions              Bartholins and Skenes: normal                 Vagina:  normal appearing vagina with normal color and discharge, no lesions              Cervix: {exam; cervix:14595}              Pap taken: {yes no:314532} Bimanual Exam:  Uterus:  {exam; uterus:12215}              Adnexa: {exam; adnexa:12223}               Rectovaginal: Confirms               Anus:  normal sphincter tone, no lesions  Chaperone was present for exam.  A:  Well Woman with normal exam  P:   {plan; gyn:5269::"mammogram","pap smear","return annually or prn"}

## 2017-04-03 ENCOUNTER — Encounter: Payer: Self-pay | Admitting: Obstetrics & Gynecology

## 2017-04-03 ENCOUNTER — Telehealth: Payer: Self-pay | Admitting: *Deleted

## 2017-04-03 ENCOUNTER — Ambulatory Visit: Payer: Self-pay | Admitting: Obstetrics & Gynecology

## 2017-04-03 NOTE — Telephone Encounter (Signed)
Patient cancelled and rescheduled aex appointment today. 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 She is aware of dnka fee.

## 2017-05-05 ENCOUNTER — Ambulatory Visit: Payer: Self-pay | Admitting: Obstetrics & Gynecology

## 2017-06-13 ENCOUNTER — Other Ambulatory Visit: Payer: Self-pay | Admitting: Obstetrics & Gynecology

## 2017-06-13 NOTE — Telephone Encounter (Signed)
Refill done.  Further refills will require AEX.  She is scheduled.

## 2017-06-13 NOTE — Telephone Encounter (Signed)
Patient called to check on the status of the request. °

## 2017-06-13 NOTE — Telephone Encounter (Signed)
Medication refill request: Diflucan  Last AEX:  09-19-15  Next AEX: 07-02-17  Last MMG (if hormonal medication request): none on file Refill authorized: please advise

## 2017-06-30 ENCOUNTER — Ambulatory Visit: Payer: Self-pay | Admitting: Obstetrics & Gynecology

## 2017-07-02 ENCOUNTER — Encounter: Payer: Self-pay | Admitting: Obstetrics & Gynecology

## 2017-07-02 ENCOUNTER — Ambulatory Visit: Payer: Self-pay | Admitting: Obstetrics & Gynecology

## 2017-07-02 ENCOUNTER — Telehealth: Payer: Self-pay | Admitting: Obstetrics & Gynecology

## 2017-07-02 NOTE — Telephone Encounter (Signed)
Patient canceled her aex today. Patient states "I am still out of town". Patient rescheduled to 08/08/17. [facility] policy followed.

## 2017-07-02 NOTE — Progress Notes (Deleted)
42 y.o. [id number] DivorcedCaucasianF here for annual exam.    Patient's last menstrual period was 07/01/2009.          Sexually active: {yes no:314532}  The current method of family planning is status post hysterectomy.    Exercising: {yes no:314532}  {types:19826} Smoker:  {YES P5382123  Health Maintenance: Pap:  09/19/15 ASCUS. HR HPV:neg   04/29/14 Neg History of abnormal Pap:  Yes, HR HPV + 08/23/2013 #16, 18/45 Neg  MMG:  05/18/04 [place] right BIRADS1:neg. F/u age 46.  TDaP:  2012 Screening Labs: ***, Hb today: ***, Urine today: ***   reports that  has never smoked. she has never used smokeless tobacco. She reports that she drinks alcohol. She reports that she does not use drugs.  Past Medical History:  Diagnosis Date  . Headache   . History of abnormal cervical Pap smear 2008   LGSIL  . Pneumonia 2014    Past Surgical History:  Procedure Laterality Date  . BILATERAL SALPINGECTOMY Bilateral 05/16/2014   Procedure: BILATERAL SALPINGECTOMY;  Surgeon: Lyman Speller, MD;  Location: [facility];  Service: Gynecology;  Laterality: Bilateral;  . CHOLECYSTECTOMY    . COLPOSCOPY  7/08   pos HPV  . CYSTOSCOPY N/A 05/16/2014   Procedure: CYSTOSCOPY;  Surgeon: Lyman Speller, MD;  Location: [facility];  Service: Gynecology;  Laterality: N/A;  . Ileocolonoscopy  10/19/2007   Normal terminal ileum approximately 10-20 cm visualized/ Normal colon / Large internal hemorrhoids/ nl colon. Melanosis coli  . INTRAUTERINE DEVICE INSERTION  09/2009   Mirena  . IUD REMOVAL N/A 05/16/2014   Procedure: INTRAUTERINE DEVICE (IUD) REMOVAL;  Surgeon: Lyman Speller, MD;  Location: [facility];  Service: Gynecology;  Laterality: N/A;  . LAPAROSCOPIC HYSTERECTOMY N/A 05/16/2014   Procedure: HYSTERECTOMY TOTAL LAPAROSCOPIC;  Surgeon: Lyman Speller, MD;  Location: [facility];  Service: Gynecology;  Laterality: N/A;    Current Outpatient Medications  Medication Sig Dispense Refill  . ALPRAZolam (XANAX)  0.5 MG tablet Take 1 tablet (0.5 mg total) by mouth at bedtime as needed for anxiety. 30 tablet 0  . fluconazole (DIFLUCAN) 150 MG tablet TAKE 1 TABLET BY MOUTH ONCE. REPEAT IN 72  HOURS IF NEEDED. 2 tablet 0  . temazepam (RESTORIL) 15 MG capsule Take 1 capsule (15 mg total) by mouth at bedtime as needed for sleep. 30 capsule 1   No current facility-administered medications for this visit.     Family History  Problem Relation Age of Onset  . Diabetes Mother   . Hyperlipidemia Father   . Lung cancer Maternal Grandmother   . Cancer Paternal Grandmother   . Colon cancer Paternal Grandmother   . Thyroid disease Paternal Grandmother   . Heart failure Paternal Grandfather   . Lung cancer Maternal Aunt     ROS:  Pertinent items are noted in HPI.  Otherwise, a comprehensive ROS was negative.  Exam:   LMP 07/01/2009   Weight change: @WEIGHTCHANGE @ Height:      Ht Readings from Last 3 Encounters:  05/28/16 5\' 5"  (1.651 m)  09/19/15 5' 4.5" (1.638 m)  08/04/14 5' 4.5" (1.638 m)    General appearance: alert, cooperative and appears stated age Head: Normocephalic, without obvious abnormality, atraumatic Neck: no adenopathy, supple, symmetrical, trachea midline and thyroid {EXAM; THYROID:18604} Lungs: clear to auscultation bilaterally Breasts: {Exam; breast:13139::"normal appearance, no masses or tenderness"} Heart: regular rate and rhythm Abdomen: soft, non-tender; bowel sounds normal; no masses,  no organomegaly Extremities: extremities normal, atraumatic, no  cyanosis or edema Skin: Skin color, texture, turgor normal. No rashes or lesions Lymph nodes: Cervical, supraclavicular, and axillary nodes normal. No abnormal inguinal nodes palpated Neurologic: Grossly normal   Pelvic: External genitalia:  no lesions              Urethra:  normal appearing urethra with no masses, tenderness or lesions              Bartholins and Skenes: normal                 Vagina: normal appearing vagina  with normal color and discharge, no lesions              Cervix: {exam; cervix:14595}              Pap taken: {yes no:314532} Bimanual Exam:  Uterus:  {exam; uterus:12215}              Adnexa: {exam; adnexa:12223}               Rectovaginal: Confirms               Anus:  normal sphincter tone, no lesions  Chaperone was present for exam.  A:  Well Woman with normal exam  P:   {plan; gyn:5269::"mammogram","pap smear","return annually or prn"}

## 2017-07-03 ENCOUNTER — Encounter: Payer: Self-pay | Admitting: Obstetrics and Gynecology

## 2017-08-08 ENCOUNTER — Ambulatory Visit: Payer: Self-pay | Admitting: Obstetrics & Gynecology

## 2017-08-20 ENCOUNTER — Telehealth: Payer: Self-pay | Admitting: Obstetrics & Gynecology

## 2017-08-20 ENCOUNTER — Encounter: Payer: Self-pay | Admitting: Obstetrics & Gynecology

## 2017-08-20 ENCOUNTER — Ambulatory Visit: Payer: Self-pay | Admitting: Obstetrics & Gynecology
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
## 2017-08-22 ENCOUNTER — Ambulatory Visit: Payer: Self-pay | Admitting: Obstetrics & Gynecology

## 2017-09-01 ENCOUNTER — Other Ambulatory Visit: Payer: Self-pay | Admitting: Obstetrics & Gynecology

## 2017-09-26 ENCOUNTER — Ambulatory Visit: Payer: Self-pay | Admitting: Obstetrics & Gynecology

## 2017-10-01 ENCOUNTER — Telehealth: Payer: Self-pay | Admitting: *Deleted

## 2017-10-01 NOTE — Telephone Encounter (Signed)
Patient is in 08 recall for 05/2017. She has R/S 2 appointments (08-20-17 and 3-39-19). She has R/S to 12-09-17. Is this date okay or does she need to be scheduled sooner. If this date is okay is it fine to extend her recall? Thanks

## 2017-10-06 NOTE — Telephone Encounter (Signed)
Ok to extend recall.

## 2017-10-06 NOTE — Telephone Encounter (Signed)
Extended recall -eh

## 2017-11-19 ENCOUNTER — Other Ambulatory Visit: Payer: Self-pay | Admitting: Obstetrics & Gynecology

## 2017-12-04 ENCOUNTER — Telehealth: Payer: Self-pay | Admitting: Obstetrics & Gynecology

## 2017-12-04 NOTE — Telephone Encounter (Signed)
Left patient a message to call back to reschedule a future appointment that was cancelled by the provider for an AEX. °

## 2017-12-09 ENCOUNTER — Ambulatory Visit: Payer: Self-pay | Admitting: Obstetrics & Gynecology

## 2018-01-04 ENCOUNTER — Other Ambulatory Visit: Payer: Self-pay | Admitting: Obstetrics & Gynecology

## 2018-10-01 ENCOUNTER — Telehealth: Payer: Managed Care, Other (non HMO) | Admitting: Physician Assistant

## 2018-10-01 DIAGNOSIS — N760 Acute vaginitis: Secondary | ICD-10-CM

## 2018-10-01 MED ORDER — FLUCONAZOLE 150 MG PO TABS: 150.0000 mg | ORAL_TABLET | Freq: Once | ORAL | 0 refills | Status: AC

## 2018-10-01 NOTE — Progress Notes (Signed)

## 2018-10-01 NOTE — Progress Notes (Signed)
I have spent 5 minutes in review of e-visit questionnaire, review and updating patient chart, medical decision making and response to patient.   Taffy Delconte Cody Zari Cly, PA-C    

## 2018-11-24 ENCOUNTER — Telehealth: Payer: Managed Care, Other (non HMO) | Admitting: Physician Assistant

## 2018-11-24 DIAGNOSIS — N898 Other specified noninflammatory disorders of vagina: Secondary | ICD-10-CM

## 2018-11-24 MED ORDER — FLUCONAZOLE 150 MG PO TABS: 150.0000 mg | ORAL_TABLET | Freq: Once | ORAL | 0 refills | Status: AC

## 2018-11-24 NOTE — Progress Notes (Signed)
We are sorry that you are not feeling well. Here is how we plan to help! Based on what you shared with me it looks like you: May have a yeast vaginosis   Vaginosis is an inflammation of the vagina that can result in discharge, itching and pain. The cause is usually a change in the normal balance of vaginal bacteria or an infection. Vaginosis can also result from reduced estrogen levels after menopause.  The most common causes of vaginosis are:   Bacterial vaginosis which results from an overgrowth of one on several organisms that are normally present in your vagina.   Yeast infections which are caused by a naturally occurring fungus called candida.   Vaginal atrophy (atrophic vaginosis) which results from the thinning of the vagina from reduced estrogen levels after menopause.   Trichomoniasis which is caused by a parasite and is commonly transmitted by sexual intercourse.  Factors that increase your risk of developing vaginosis include: Medications, such as antibiotics and steroids Uncontrolled diabetes Use of hygiene products such as bubble bath, vaginal spray or vaginal deodorant Douching Wearing damp or tight-fitting clothing Using an intrauterine device (IUD) for birth control Hormonal changes, such as those associated with pregnancy, birth control pills or menopause Sexual activity Having a sexually transmitted infection  Your treatment plan is A single Diflucan (fluconazole) 150mg  tablet with an extra tab to take 48-72 hours later as needed.  I have electronically sent this prescription into the pharmacy that you have chosen.  Be sure to take all of the medication as directed. Stop taking any medication if you develop a rash, tongue swelling or shortness of breath. Mothers who are breast feeding should consider pumping and discarding their breast milk while on these antibiotics. However, there is no consensus that infant exposure at these doses would be harmful.  Remember that  medication creams can weaken latex condoms. Marland Kitchen   HOME CARE:  Good hygiene may prevent some types of vaginosis from recurring and may relieve some symptoms:  Avoid baths, hot tubs and whirlpool spas. Rinse soap from your outer genital area after a shower, and dry the area well to prevent irritation. Don't use scented or harsh soaps, such as those with deodorant or antibacterial action. Avoid irritants. These include scented tampons and pads. Wipe from front to back after using the toilet. Doing so avoids spreading fecal bacteria to your vagina.  Other things that may help prevent vaginosis include:  Don't douche. Your vagina doesn't require cleansing other than normal bathing. Repetitive douching disrupts the normal organisms that reside in the vagina and can actually increase your risk of vaginal infection. Douching won't clear up a vaginal infection. Use a latex condom. Both female and female latex condoms may help you avoid infections spread by sexual contact. Wear cotton underwear. Also wear pantyhose with a cotton crotch. If you feel comfortable without it, skip wearing underwear to bed. Yeast thrives in [employer] Your symptoms should improve in the next day or two.  GET HELP RIGHT AWAY IF:  You have pain in your lower abdomen ( pelvic area or over your ovaries) You develop nausea or vomiting You develop a fever Your discharge changes or worsens You have persistent pain with intercourse You develop shortness of breath, a rapid pulse, or you faint.  These symptoms could be signs of problems or infections that need to be evaluated by a medical provider now.  MAKE SURE YOU   Understand these instructions. Will watch your condition. Will get help  right away if you are not doing well or get worse.  Your e-visit answers were reviewed by a board certified advanced clinical practitioner to complete your personal care plan. Depending upon the condition, your plan could have  included both over the counter or prescription medications. Please review your pharmacy choice to make sure that you have choses a pharmacy that is open for you to pick up any needed prescription, Your safety is important to [place]. If you have drug allergies check your prescription carefully.   You can use MyChart to ask questions about today's visit, request a non-urgent call back, or ask for a work or school excuse for 24 hours related to this e-Visit. If it has been greater than 24 hours you will need to follow up with your provider, or enter a new e-Visit to address those concerns. You will get a MyChart message within the next two days asking about your experience. I hope that your e-visit has been valuable and will speed your recovery.  ===View-only below this line===   ----- Message -----    From: [name]    Sent: 11/24/2018  8:15 AM EDT      To: E-Visit Mailing List Subject: E-Visit Submission: Vaginal Symptoms  E-Visit Submission: Vaginal Symptoms --------------------------------  Question: Which of the following are you experiencing? Answer:   Vaginal itching  Question: Are you having pain while passing urine? Answer:   No, I have no pain while urinating  Question: Which of the following applies to your vaginal discharge Answer:   I have a white/milky discharge  Question: Are you pregnant? Answer:   I am confident that I am not pregnant  Question: Are you breastfeeding? Answer:   No  Question: Which of the following are you experiencing? Answer:   None of the above  Question: Do you have any sores on your genitals? Answer:   No  Question: Have you taken antibiotics recently? Answer:   I have not been on any antibiotics  Question: Do you do any of the following? Answer:   None of the above  Question: Which of the following applies to your menstrual period? Answer:   I have not had a menstrual period for a while  Question: Have you had similar symptoms in  the past? Answer:   Yes, I have had similar symptoms before  Question: When you had similar symptoms in  the past, did any of the following work? Answer:   Pills for yeast infection  Question: Do you have a fever? Answer:   No, I do not have a fever  Question: During the past 2 months, have you had sexual contact with a specific person for the first time? Answer:   No  Question: Has a person with whom you have had sexual contact been recently told they have a disease possibly acquired through sex? Answer:   No  Question: Please list your medication allergies that you may have ? (If 'none' , please list as 'none') Answer:   Ceclor             Stadol  Question: Please list any additional comments  Answer:     A total of 5-10 minutes was spent evaluating this patients questionnaire and formulating a plan of care.

## 2020-05-07 ENCOUNTER — Emergency Department (HOSPITAL_COMMUNITY)
Admission: EM | Admit: 2020-05-07 | Discharge: 2020-05-08 | Disposition: A | Discharge status: Home / Self Care | Payer: Self-pay | Attending: Emergency Medicine | Admitting: Emergency Medicine

## 2020-05-07 DIAGNOSIS — S8012XA Contusion of left lower leg, initial encounter: Secondary | ICD-10-CM | POA: Diagnosis not present

## 2020-05-07 DIAGNOSIS — M25551 Pain in right hip: Secondary | ICD-10-CM | POA: Diagnosis not present

## 2020-05-07 DIAGNOSIS — Y92410 Unspecified street and highway as the place of occurrence of the external cause: Secondary | ICD-10-CM | POA: Insufficient documentation

## 2020-05-07 DIAGNOSIS — S8992XA Unspecified injury of left lower leg, initial encounter: Secondary | ICD-10-CM | POA: Diagnosis present

## 2020-05-08 ENCOUNTER — Other Ambulatory Visit: Payer: Self-pay

## 2020-05-08 ENCOUNTER — Emergency Department (HOSPITAL_COMMUNITY): Payer: Self-pay

## 2020-05-08 ENCOUNTER — Encounter (HOSPITAL_COMMUNITY): Payer: Self-pay | Admitting: Emergency Medicine

## 2020-05-08 MED ORDER — KETOROLAC TROMETHAMINE 30 MG/ML IJ SOLN
30.0000 mg | Freq: Once | INTRAMUSCULAR | Status: AC
  Administered 2020-05-08: 30 mg via INTRAMUSCULAR
  Filled 2020-05-08: qty 1

## 2020-05-08 MED ORDER — NAPROXEN 500 MG PO TABS: 500.0000 mg | ORAL_TABLET | Freq: Two times a day (BID) | ORAL | 0 refills | Status: DC

## 2020-05-08 MED ORDER — CYCLOBENZAPRINE HCL 5 MG PO TABS: 5.0000 mg | ORAL_TABLET | Freq: Three times a day (TID) | ORAL | 0 refills | Status: DC | PRN
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
## 2020-05-08 NOTE — Discharge Instructions (Addendum)
Ice packs to the injured or sore muscles for the next several days then start using heat. Take the medications for pain and muscle spasms. Return to the ED for any problems listed on the head injury sheet.  Follow-up with your orthopedist about your hip pain.

## 2020-07-01 DIAGNOSIS — B029 Zoster without complications: Secondary | ICD-10-CM

## 2020-07-01 HISTORY — DX: Zoster without complications: B02.9

## 2020-07-21 ENCOUNTER — Other Ambulatory Visit: Payer: Self-pay | Admitting: Orthopedic Surgery

## 2020-07-21 DIAGNOSIS — Z01818 Encounter for other preprocedural examination: Secondary | ICD-10-CM

## 2020-08-03 NOTE — Progress Notes (Addendum)
COVID Vaccine Completed:  No Date COVID Vaccine completed: COVID vaccine manufacturer: [employer]   PCP -Aldean Ast, PA in [place], [place] Cardiologist - N/A  Chest x-ray - 08-04-20 in Epic EKG - 08-04-20 in Epic Stress Test -  ECHO -  Cardiac Cath -  Pacemaker/ICD device last checked:  Sleep Study - N/A CPAP -   Fasting Blood Sugar - N/A Checks Blood Sugar _____ times a day  Blood Thinner Instructions:N/A Aspirin Instructions: Last Dose:  Anesthesia review:   Patient denies shortness of breath, fever, cough and chest pain at PAT appointment.  Patient had Covid three weeks ago by home test and states that she did have some shortness of breath at that time.  Activity:  Patient able to climb a flight of stairs and perform housework   Patient verbalized understanding of instructions that were given to them at the PAT appointment. Patient was also instructed that they will need to review over the PAT instructions again at home before surgery.

## 2020-08-03 NOTE — Patient Instructions (Addendum)
DUE TO COVID-19 ONLY ONE VISITOR IS ALLOWED TO COME WITH YOU AND STAY IN THE WAITING ROOM ONLY DURING PRE OP AND PROCEDURE.   IF YOU WILL BE ADMITTED INTO THE HOSPITAL YOU ARE ALLOWED ONE SUPPORT PERSON DURING VISITATION HOURS ONLY (10AM -8PM)    The support person may change daily.  The support person must pass our screening, gel in and out, and wear a mask at all times, including in the patients room.  Patients must also wear a mask when staff or their support person are in the room.   COVID SWAB TESTING MUST BE COMPLETED ON:  Friday, 08-11-20 @ 2:45 PM   [address] [place], [place] [address]  (Must self quarantine after testing. Follow instructions on handout.)        Your procedure is scheduled on:  Monday, 08-15-19   Report to [facility] Main  Entrance   Report to admitting at 7:15 AM   Call this number if you have problems the morning of surgery [phone]   Do not eat food :After Midnight.   May have liquids until 6:45 AM day of surgery  CLEAR LIQUID DIET  Foods Allowed                                                                     Foods Excluded  Water, Black Coffee and tea, regular and decaf               liquids that you cannot  Plain Jell-O in any flavor  (No red)                                      see through such as: Fruit ices (not with fruit pulp)                                      milk, soups, orange juice              Iced Popsicles (No red)                                      All solid food                                   Apple juices Sports drinks like Gatorade (No red) Lightly seasoned clear broth or consume(fat free) Sugar, honey syrup                            Complete one Ensure drink the morning of surgery at 6:45 AM  the day of surgery.      1. The day of surgery:  ? Drink ONE (1) Pre-Surgery Clear Ensure or G2 by am the morning of surgery. Drink in one sitting. Do not sip.  ? This drink was given to you during your  hospital  pre-op appointment visit. ? Nothing  else to drink after completing the  Pre-Surgery Clear Ensure or G2.          If you have questions, please contact your surgeons office.     Oral Hygiene is also important to reduce your risk of infection.                                    Remember - BRUSH YOUR TEETH THE MORNING OF SURGERY WITH YOUR REGULAR TOOTHPASTE   Do NOT smoke after Midnight   Take these medicines the morning of surgery with A SIP OF WATER:  None                               You may not have any metal on your body including hair pins, jewelry, and body piercings             Do not wear make-up, lotions, powders, perfumes/cologne, or deodorant             Do not wear nail polish.  Do not shave  48 hours prior to surgery.         Do not bring valuables to the hospital. [place].   Contacts, dentures or bridgework may not be worn into surgery.     Patients discharged the day of surgery will not be allowed to drive home.               Please read over the following fact sheets you were given: IF YOU HAVE QUESTIONS ABOUT YOUR PRE OP INSTRUCTIONS PLEASE CALL [phone]   [place] - Preparing for Surgery Before surgery, you can play an important role.  Because skin is not sterile, your skin needs to be as free of germs as possible.  You can reduce the number of germs on your skin by washing with CHG (chlorahexidine gluconate) soap before surgery.  CHG is an antiseptic cleaner which kills germs and bonds with the skin to continue killing germs even after washing. Please DO NOT use if you have an allergy to CHG or antibacterial soaps.  If your skin becomes reddened/irritated stop using the CHG and inform your nurse when you arrive at Short Stay. Do not shave (including legs and underarms) for at least 48 hours prior to the first CHG shower.  You may shave your face/neck.  Please follow these instructions carefully:  1.   Shower with CHG Soap the night before surgery and the  morning of surgery.  2.  If you choose to wash your hair, wash your hair first as usual with your normal  shampoo.  3.  After you shampoo, rinse your hair and body thoroughly to remove the shampoo.                             4.  Use CHG as you would any other liquid soap.  You can apply chg directly to the skin and wash.  Gently with a scrungie or clean washcloth.  5.  Apply the CHG Soap to your body ONLY FROM THE NECK DOWN.   Do   not use on face/ open  Wound or open sores. Avoid contact with eyes, ears mouth and   genitals (private parts).                       Wash face,  Genitals (private parts) with your normal soap.             6.  Wash thoroughly, paying special attention to the area where your    surgery  will be performed.  7.  Thoroughly rinse your body with warm water from the neck down.  8.  DO NOT shower/wash with your normal soap after using and rinsing off the CHG Soap.                9.  Pat yourself dry with a clean towel.            10.  Wear clean pajamas.            11.  Place clean sheets on your bed the night of your first shower and do not  sleep with pets. Day of Surgery : Do not apply any lotions/deodorants the morning of surgery.  Please wear clean clothes to the hospital/surgery center.  FAILURE TO FOLLOW THESE INSTRUCTIONS MAY RESULT IN THE CANCELLATION OF YOUR SURGERY  PATIENT SIGNATURE_________________________________  NURSE SIGNATURE__________________________________  ________________________________________________________________________   Amy Lee  An incentive spirometer is a tool that can help keep your lungs clear and active. This tool measures how well you are filling your lungs with each breath. Taking long deep breaths may help reverse or decrease the chance of developing breathing (pulmonary) problems (especially infection) following:  A long period of  time when you are unable to move or be active. BEFORE THE PROCEDURE   If the spirometer includes an indicator to show your best effort, your nurse or respiratory therapist will set it to a desired goal.  If possible, sit up straight or lean slightly forward. Try not to slouch.  Hold the incentive spirometer in an upright position. INSTRUCTIONS FOR USE  1. Sit on the edge of your bed if possible, or sit up as far as you can in bed or on a chair. 2. Hold the incentive spirometer in an upright position. 3. Breathe out normally. 4. Place the mouthpiece in your mouth and seal your lips tightly around it. 5. Breathe in slowly and as deeply as possible, raising the piston or the ball toward the top of the column. 6. Hold your breath for 3-5 seconds or for as long as possible. Allow the piston or ball to fall to the bottom of the column. 7. Remove the mouthpiece from your mouth and breathe out normally. 8. Rest for a few seconds and repeat Steps 1 through 7 at least 10 times every 1-2 hours when you are awake. Take your time and take a few normal breaths between deep breaths. 9. The spirometer may include an indicator to show your best effort. Use the indicator as a goal to work toward during each repetition. 10. After each set of 10 deep breaths, practice coughing to be sure your lungs are clear. If you have an incision (the cut made at the time of surgery), support your incision when coughing by placing a pillow or rolled up towels firmly against it. Once you are able to get out of bed, walk around indoors and cough well. You may stop using the incentive spirometer when instructed by your caregiver.  RISKS AND COMPLICATIONS  Take your time  so you do not get dizzy or light-headed.  If you are in pain, you may need to take or ask for pain medication before doing incentive spirometry. It is harder to take a deep breath if you are having pain. AFTER USE  Rest and breathe slowly and easily.  It can  be helpful to keep track of a log of your progress. Your caregiver can provide you with a simple table to help with this. If you are using the spirometer at home, follow these instructions: [place] IF:   You are having difficultly using the spirometer.  You have trouble using the spirometer as often as instructed.  Your pain medication is not giving enough relief while using the spirometer.  You develop fever of 100.5 F (38.1 C) or higher. SEEK IMMEDIATE MEDICAL CARE IF:   You cough up bloody sputum that had not been present before.  You develop fever of 102 F (38.9 C) or greater.  You develop worsening pain at or near the incision site. MAKE SURE YOU:   Understand these instructions.  Will watch your condition.  Will get help right away if you are not doing well or get worse. Document Released: 10/28/2006 Document Revised: 09/09/2011 Document Reviewed: 12/29/2006 [employer] Patient Information 2014 [employer], [place].   ________________________________________________________________________  WHAT IS A BLOOD TRANSFUSION? Blood Transfusion Information  A transfusion is the replacement of blood or some of its parts. Blood is made up of multiple cells which provide different functions.  Red blood cells carry oxygen and are used for blood loss replacement.  White blood cells fight against infection.  Platelets control bleeding.  Plasma helps clot blood.  Other blood products are available for specialized needs, such as hemophilia or other clotting disorders. BEFORE THE TRANSFUSION  Who gives blood for transfusions?   Healthy volunteers who are fully evaluated to make sure their blood is safe. This is blood bank blood. Transfusion therapy is the safest it has ever been in the practice of medicine. Before blood is taken from a donor, a complete history is taken to make sure that person has no history of diseases nor engages in risky social behavior (examples are  intravenous drug use or sexual activity with multiple partners). The donor's travel history is screened to minimize risk of transmitting infections, such as malaria. The donated blood is tested for signs of infectious diseases, such as HIV and hepatitis. The blood is then tested to be sure it is compatible with you in order to minimize the chance of a transfusion reaction. If you or a relative donates blood, this is often done in anticipation of surgery and is not appropriate for emergency situations. It takes many days to process the donated blood. RISKS AND COMPLICATIONS Although transfusion therapy is very safe and saves many lives, the main dangers of transfusion include:   Getting an infectious disease.  Developing a transfusion reaction. This is an allergic reaction to something in the blood you were given. Every precaution is taken to prevent this. The decision to have a blood transfusion has been considered carefully by your caregiver before blood is given. Blood is not given unless the benefits outweigh the risks. AFTER THE TRANSFUSION  Right after receiving a blood transfusion, you will usually feel much better and more energetic. This is especially true if your red blood cells have gotten low (anemic). The transfusion raises the level of the red blood cells which carry oxygen, and this usually causes an energy increase.  The  nurse administering the transfusion will monitor you carefully for complications. HOME CARE INSTRUCTIONS  No special instructions are needed after a transfusion. You may find your energy is better. Speak with your caregiver about any limitations on activity for underlying diseases you may have. SEEK MEDICAL CARE IF:   Your condition is not improving after your transfusion.  You develop redness or irritation at the intravenous (IV) site. SEEK IMMEDIATE MEDICAL CARE IF:  Any of the following symptoms occur over the next 12 hours:  Shaking chills.  You have a  temperature by mouth above 102 F (38.9 C), not controlled by medicine.  Chest, back, or muscle pain.  People around you feel you are not acting correctly or are confused.  Shortness of breath or difficulty breathing.  Dizziness and fainting.  You get a rash or develop hives.  You have a decrease in urine output.  Your urine turns a dark color or changes to pink, red, or brown. Any of the following symptoms occur over the next 10 days:  You have a temperature by mouth above 102 F (38.9 C), not controlled by medicine.  Shortness of breath.  Weakness after normal activity.  The white part of the eye turns yellow (jaundice).  You have a decrease in the amount of urine or are urinating less often.  Your urine turns a dark color or changes to pink, red, or brown. Document Released: 06/14/2000 Document Revised: 09/09/2011 Document Reviewed: 02/01/2008 [facility] Patient Information 2014 [place], [place].  _______________________________________________________________________

## 2020-08-04 ENCOUNTER — Other Ambulatory Visit: Payer: Self-pay

## 2020-08-04 ENCOUNTER — Ambulatory Visit (HOSPITAL_COMMUNITY)
Admission: RE | Admit: 2020-08-04 | Discharge: 2020-08-04 | Disposition: A | Discharge status: Home / Self Care | Payer: 59 | Source: Ambulatory Visit | Attending: Orthopedic Surgery | Admitting: Orthopedic Surgery

## 2020-08-04 ENCOUNTER — Encounter (HOSPITAL_COMMUNITY)
Admission: RE | Admit: 2020-08-04 | Discharge: 2020-08-04 | Disposition: A | Discharge status: Home / Self Care | Payer: 59 | Source: Ambulatory Visit | Attending: Orthopedic Surgery | Admitting: Orthopedic Surgery

## 2020-08-04 ENCOUNTER — Encounter (HOSPITAL_COMMUNITY): Payer: Self-pay

## 2020-08-04 DIAGNOSIS — Z01818 Encounter for other preprocedural examination: Secondary | ICD-10-CM | POA: Diagnosis not present

## 2020-08-04 HISTORY — DX: Unspecified osteoarthritis, unspecified site: M19.90

## 2020-08-04 LAB — URINALYSIS, ROUTINE W REFLEX MICROSCOPIC
Bilirubin Urine: NEGATIVE
Glucose, UA: NEGATIVE mg/dL
Hgb urine dipstick: NEGATIVE
Ketones, ur: 5 mg/dL — AB
Leukocytes,Ua: NEGATIVE
Nitrite: NEGATIVE
Protein, ur: NEGATIVE mg/dL
Specific Gravity, Urine: 1.026 (ref 1.005–1.030)
pH: 5 (ref 5.0–8.0)

## 2020-08-04 LAB — BASIC METABOLIC PANEL
Anion gap: 10 (ref 5–15)
BUN: 14 mg/dL (ref 6–20)
CO2: 24 mmol/L (ref 22–32)
Calcium: 9.2 mg/dL (ref 8.9–10.3)
Chloride: 102 mmol/L (ref 98–111)
Creatinine, Ser: 0.85 mg/dL (ref 0.44–1.00)
GFR, Estimated: >60 mL/min (ref >60)
Glucose, Bld: 87 mg/dL (ref 70–99)
Potassium: 4.1 mmol/L (ref 3.5–5.1)
Sodium: 136 mmol/L (ref 135–145)

## 2020-08-04 LAB — CBC WITH DIFFERENTIAL/PLATELET
Abs Immature Granulocytes: 0.01 10*3/uL (ref 0.00–0.07)
Basophils Absolute: 0 10*3/uL (ref 0.0–0.1)
Basophils Relative: 1 %
Eosinophils Absolute: 0.1 10*3/uL (ref 0.0–0.5)
Eosinophils Relative: 1 %
HCT: 38.2 % (ref 36.0–46.0)
Hemoglobin: 12.9 g/dL (ref 12.0–15.0)
Immature Granulocytes: 0 %
Lymphocytes Relative: 29 %
Lymphs Abs: 1.9 10*3/uL (ref 0.7–4.0)
MCH: 29.6 pg (ref 26.0–34.0)
MCHC: 33.8 g/dL (ref 30.0–36.0)
MCV: 87.6 fL (ref 80.0–100.0)
Monocytes Absolute: 0.5 10*3/uL (ref 0.1–1.0)
Monocytes Relative: 8 %
Neutro Abs: 4 10*3/uL (ref 1.7–7.7)
Neutrophils Relative %: 61 %
Platelets: 154 10*3/uL (ref 150–400)
RBC: 4.36 MIL/uL (ref 3.87–5.11)
RDW: 11.2 % — ABNORMAL LOW (ref 11.5–15.5)
WBC: 6.6 10*3/uL (ref 4.0–10.5)
nRBC: 0 % (ref 0.0–0.2)

## 2020-08-04 LAB — PROTIME-INR
INR: 1 (ref 0.8–1.2)
Prothrombin Time: 12.6 seconds (ref 11.4–15.2)

## 2020-08-04 LAB — SURGICAL PCR SCREEN
MRSA, PCR: NEGATIVE
Staphylococcus aureus: NEGATIVE

## 2020-08-04 LAB — APTT: aPTT: 39 seconds — ABNORMAL HIGH (ref 24–36)

## 2020-08-09 DIAGNOSIS — M1611 Unilateral primary osteoarthritis, right hip: Secondary | ICD-10-CM | POA: Diagnosis present

## 2020-08-09 NOTE — H&P (Signed)
TOTAL HIP ADMISSION H&P  Patient is admitted for right total hip arthroplasty.  Subjective:  Chief Complaint: right hip pain  HPI: Amy Lee, 45 y.o. female, has a history of pain and functional disability in the right hip(s) due to moderate arthritis and degenerative labral tear and patient has failed non-surgical conservative treatments for greater than 12 weeks to include NSAID's and/or analgesics, corticosteriod injections, flexibility and strengthening excercises, supervised PT with diminished ADL's post treatment, use of assistive devices and activity modification.  Onset of symptoms was gradual starting 1 years ago with rapidlly worsening course since that time.The patient noted no past surgery on the right hip(s).  Patient currently rates pain in the right hip at 10 out of 10 with activity. Patient has night pain, worsening of pain with activity and weight bearing, trendelenberg gait, pain that interfers with activities of daily living, pain with passive range of motion and joint swelling. Patient has evidence of labral tear by imaging studies. This condition presents safety issues increasing the risk of falls.   There is no current active infection.  Patient Active Problem List   Diagnosis Date Noted  . Adjustment disorder with anxious mood 05/30/2016   Past Medical History:  Diagnosis Date  . Arthritis   . Headache   . History of abnormal cervical Pap smear 2008   LGSIL  . Pneumonia 2014    Past Surgical History:  Procedure Laterality Date  . ABDOMINAL HYSTERECTOMY    . BILATERAL SALPINGECTOMY Bilateral 05/16/2014   Procedure: BILATERAL SALPINGECTOMY;  Surgeon: Lyman Speller, MD;  Location: [facility];  Service: Gynecology;  Laterality: Bilateral;  . CHOLECYSTECTOMY    . COLPOSCOPY  7/08   pos HPV  . CYSTOSCOPY N/A 05/16/2014   Procedure: CYSTOSCOPY;  Surgeon: Lyman Speller, MD;  Location: [facility];  Service: Gynecology;  Laterality: N/A;  . Ileocolonoscopy   10/19/2007   Normal terminal ileum approximately 10-20 cm visualized/ Normal colon / Large internal hemorrhoids/ nl colon. Melanosis coli  . INTRAUTERINE DEVICE INSERTION  09/2009   Mirena  . IUD REMOVAL N/A 05/16/2014   Procedure: INTRAUTERINE DEVICE (IUD) REMOVAL;  Surgeon: Lyman Speller, MD;  Location: [facility];  Service: Gynecology;  Laterality: N/A;  . LAPAROSCOPIC HYSTERECTOMY N/A 05/16/2014   Procedure: HYSTERECTOMY TOTAL LAPAROSCOPIC;  Surgeon: Lyman Speller, MD;  Location: [facility];  Service: Gynecology;  Laterality: N/A;    No current facility-administered medications for this encounter.   Current Outpatient Medications  Medication Sig Dispense Refill Last Dose  . oxyCODONE-acetaminophen (PERCOCET) 10-325 MG tablet Take 1 tablet by mouth 3 (three) times daily as needed for pain.     . cyclobenzaprine (FLEXERIL) 5 MG tablet Take 1 tablet (5 mg total) by mouth 3 (three) times daily as needed. (Patient not taking: No sig reported) 30 tablet 0 Completed Course at Unknown time  . naproxen (NAPROSYN) 500 MG tablet Take 1 tablet (500 mg total) by mouth 2 (two) times daily. (Patient not taking: No sig reported) 30 tablet 0 Completed Course at Unknown time   Allergies  Allergen Reactions  . Ceclor [Cefaclor] Rash  . Fioricet-Codeine [Butalbital-Apap-Caff-Cod] Rash  . Stadol [Butorphanol] Rash    Social History   Tobacco Use  . Smoking status: Never Smoker  . Smokeless tobacco: Never Used  Substance Use Topics  . Alcohol use: Yes    Comment: occ wine    Family History  Problem Relation Age of Onset  . Diabetes Mother   . Hyperlipidemia Father   .  Lung cancer Maternal Grandmother   . Cancer Paternal Grandmother   . Colon cancer Paternal Grandmother   . Thyroid disease Paternal Grandmother   . Heart failure Paternal Grandfather   . Lung cancer Maternal Aunt      Review of Systems  Constitutional: Negative.   HENT: Negative.   Eyes: Negative.   Respiratory:  Negative.   Cardiovascular: Negative.   Gastrointestinal: Negative.   Endocrine: Negative.   Genitourinary: Negative.   Musculoskeletal: Positive for joint swelling and myalgias.  Skin: Negative.   Allergic/Immunologic: Negative.   Neurological: Negative.   Hematological: Negative.   Psychiatric/Behavioral: Negative.     Objective:  Physical Exam Constitutional:      Appearance: Normal appearance. She is normal weight.  HENT:     Head: Normocephalic and atraumatic.     Nose: Nose normal.  Eyes:     Pupils: Pupils are equal, round, and reactive to light.  Cardiovascular:     Pulses: Normal pulses.  Pulmonary:     Effort: Pulmonary effort is normal.  Musculoskeletal:        General: Tenderness present.     Cervical back: Normal range of motion and neck supple.     Comments: Patient walks with a profound right-sided limp and walks very slowly. And internal rotation of the right hip reproduces severe pain with pelvic shift in the seated position supine in 45 of flexion as does abduction.   Skin:    General: Skin is warm and dry.  Neurological:     General: No focal deficit present.     Mental Status: She is alert and oriented to person, place, and time. Mental status is at baseline.  Psychiatric:        Mood and Affect: Mood normal.        Behavior: Behavior normal.        Thought Content: Thought content normal.        Judgment: Judgment normal.     Vital signs in last 24 hours:    Labs:   Estimated body mass index is 28.43 kg/m as calculated from the following:   Height as of 08/04/20: 5\' 4"  (1.626 m).   Weight as of 08/04/20: 75.1 kg.   Imaging Review Plain radiographs demonstrate  AP pelvis done in the office today does not show any overt arthritic changes.   Her MRI scan is reviewed showing moderate arthritic changes to the right hip joint. There is also degenerative tearing of the labrum.    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 The treatment options including medical management, injection therapy, arthroscopy and arthroplasty were discussed at length. The risks and benefits of total hip arthroplasty were presented and reviewed. The risks due to aseptic loosening, infection, stiffness, dislocation/subluxation,  thromboembolic complications and other imponderables were discussed.  The patient acknowledged the explanation, agreed to proceed with the plan and consent was signed. Patient is being admitted for inpatient treatment for surgery, pain control, PT, OT, prophylactic antibiotics, VTE prophylaxis, progressive ambulation and ADL's and discharge planning.The patient is planning to be discharged home with home health services    Patient's anticipated LOS is less than 2 midnights, meeting these requirements: - Younger than 59 - Lives within 1 hour of care - Has a competent adult  at home to recover with post-op recover - NO history of  - Chronic pain requiring opiods  - Diabetes  - Coronary Artery Disease  - Heart failure  - Heart attack  - Stroke  - DVT/VTE  - Cardiac arrhythmia  - Respiratory Failure/COPD  - Renal failure  - Anemia  - Advanced Liver disease

## 2020-08-11 ENCOUNTER — Other Ambulatory Visit (HOSPITAL_COMMUNITY)
Admission: RE | Admit: 2020-08-11 | Discharge: 2020-08-11 | Disposition: A | Discharge status: Home / Self Care | Payer: 59 | Source: Ambulatory Visit | Attending: Orthopedic Surgery | Admitting: Orthopedic Surgery

## 2020-08-11 DIAGNOSIS — Z01812 Encounter for preprocedural laboratory examination: Secondary | ICD-10-CM | POA: Insufficient documentation

## 2020-08-11 DIAGNOSIS — Z20822 Contact with and (suspected) exposure to covid-19: Secondary | ICD-10-CM | POA: Insufficient documentation

## 2020-08-11 LAB — SARS CORONAVIRUS 2 (TAT 6-24 HRS): SARS Coronavirus 2: NEGATIVE

## 2020-08-13 MED ORDER — TRANEXAMIC ACID 1000 MG/10ML IV SOLN
2000.0000 mg | INTRAVENOUS | Status: DC
  Filled 2020-08-13: qty 20

## 2020-08-13 MED ORDER — BUPIVACAINE LIPOSOME 1.3 % IJ SUSP
10.0000 mL | INTRAMUSCULAR | Status: DC
  Filled 2020-08-13: qty 10

## 2020-08-14 ENCOUNTER — Ambulatory Visit (HOSPITAL_COMMUNITY): Payer: 59 | Admitting: Certified Registered Nurse Anesthetist

## 2020-08-14 ENCOUNTER — Encounter (HOSPITAL_COMMUNITY): Payer: Self-pay | Admitting: Orthopedic Surgery

## 2020-08-14 ENCOUNTER — Encounter (HOSPITAL_COMMUNITY)
Admission: RE | Disposition: A | Discharge status: Home / Self Care | Payer: Self-pay | Source: Home / Self Care | Attending: Orthopedic Surgery

## 2020-08-14 ENCOUNTER — Ambulatory Visit (HOSPITAL_COMMUNITY): Payer: 59

## 2020-08-14 ENCOUNTER — Ambulatory Visit (HOSPITAL_COMMUNITY)
Admission: RE | Admit: 2020-08-14 | Discharge: 2020-08-14 | Disposition: A | Discharge status: Home / Self Care | Payer: 59 | Attending: Orthopedic Surgery | Admitting: Orthopedic Surgery

## 2020-08-14 DIAGNOSIS — Z8349 Family history of other endocrine, nutritional and metabolic diseases: Secondary | ICD-10-CM | POA: Insufficient documentation

## 2020-08-14 DIAGNOSIS — Z8249 Family history of ischemic heart disease and other diseases of the circulatory system: Secondary | ICD-10-CM | POA: Insufficient documentation

## 2020-08-14 DIAGNOSIS — Z885 Allergy status to narcotic agent status: Secondary | ICD-10-CM | POA: Insufficient documentation

## 2020-08-14 DIAGNOSIS — Z881 Allergy status to other antibiotic agents status: Secondary | ICD-10-CM | POA: Insufficient documentation

## 2020-08-14 DIAGNOSIS — Z800 Family history of malignant neoplasm of digestive organs: Secondary | ICD-10-CM | POA: Insufficient documentation

## 2020-08-14 DIAGNOSIS — M1611 Unilateral primary osteoarthritis, right hip: Secondary | ICD-10-CM | POA: Diagnosis not present

## 2020-08-14 DIAGNOSIS — Z79899 Other long term (current) drug therapy: Secondary | ICD-10-CM | POA: Diagnosis not present

## 2020-08-14 DIAGNOSIS — Z419 Encounter for procedure for purposes other than remedying health state, unspecified: Secondary | ICD-10-CM

## 2020-08-14 DIAGNOSIS — Z888 Allergy status to other drugs, medicaments and biological substances status: Secondary | ICD-10-CM | POA: Insufficient documentation

## 2020-08-14 DIAGNOSIS — Z801 Family history of malignant neoplasm of trachea, bronchus and lung: Secondary | ICD-10-CM | POA: Diagnosis not present

## 2020-08-14 DIAGNOSIS — Z791 Long term (current) use of non-steroidal anti-inflammatories (NSAID): Secondary | ICD-10-CM | POA: Insufficient documentation

## 2020-08-14 DIAGNOSIS — Z833 Family history of diabetes mellitus: Secondary | ICD-10-CM | POA: Diagnosis not present

## 2020-08-14 HISTORY — PX: TOTAL HIP ARTHROPLASTY: SHX124

## 2020-08-14 LAB — TYPE AND SCREEN
ABO/RH(D): O POS
Antibody Screen: NEGATIVE

## 2020-08-14 SURGERY — ARTHROPLASTY, HIP, TOTAL, ANTERIOR APPROACH
Anesthesia: Spinal | Site: Hip | Laterality: Right

## 2020-08-14 MED ORDER — FENTANYL CITRATE (PF) 100 MCG/2ML IJ SOLN
INTRAMUSCULAR | Status: DC | PRN
  Administered 2020-08-14: 100 ug via INTRAVENOUS

## 2020-08-14 MED ORDER — BUPIVACAINE-EPINEPHRINE (PF) 0.25% -1:200000 IJ SOLN
INTRAMUSCULAR | Status: AC
  Filled 2020-08-14: qty 30

## 2020-08-14 MED ORDER — FENTANYL CITRATE (PF) 100 MCG/2ML IJ SOLN
INTRAMUSCULAR | Status: AC
  Administered 2020-08-14: 50 ug via INTRAVENOUS
  Filled 2020-08-14: qty 2

## 2020-08-14 MED ORDER — PROPOFOL 10 MG/ML IV BOLUS
INTRAVENOUS | Status: AC
  Filled 2020-08-14: qty 40

## 2020-08-14 MED ORDER — BUPIVACAINE LIPOSOME 1.3 % IJ SUSP
INTRAMUSCULAR | Status: DC | PRN
  Administered 2020-08-14: 10 mL

## 2020-08-14 MED ORDER — TRANEXAMIC ACID 1000 MG/10ML IV SOLN
INTRAVENOUS | Status: DC | PRN
  Administered 2020-08-14: 2000 mg via TOPICAL

## 2020-08-14 MED ORDER — OXYCODONE-ACETAMINOPHEN 5-325 MG PO TABS: 1.0000 | ORAL_TABLET | ORAL | 0 refills | Status: DC | PRN

## 2020-08-14 MED ORDER — PROPOFOL 500 MG/50ML IV EMUL
INTRAVENOUS | Status: DC | PRN
  Administered 2020-08-14: 75 ug/kg/min via INTRAVENOUS

## 2020-08-14 MED ORDER — CHLORHEXIDINE GLUCONATE 0.12 % MT SOLN: 15.0000 mL | Freq: Once | OROMUCOSAL | Status: AC

## 2020-08-14 MED ORDER — ASPIRIN EC 81 MG PO TBEC: 81.0000 mg | DELAYED_RELEASE_TABLET | Freq: Two times a day (BID) | ORAL | 0 refills | Status: DC

## 2020-08-14 MED ORDER — PHENYLEPHRINE 40 MCG/ML (10ML) SYRINGE FOR IV PUSH (FOR BLOOD PRESSURE SUPPORT)
PREFILLED_SYRINGE | INTRAVENOUS | Status: DC | PRN
  Administered 2020-08-14 (×5): 80 ug via INTRAVENOUS

## 2020-08-14 MED ORDER — LACTATED RINGERS IV SOLN: INTRAVENOUS | Status: DC

## 2020-08-14 MED ORDER — HYDROMORPHONE HCL 1 MG/ML IJ SOLN
0.5000 mg | INTRAMUSCULAR | Status: DC | PRN
  Administered 2020-08-14: 1 mg via INTRAVENOUS

## 2020-08-14 MED ORDER — FENTANYL CITRATE (PF) 100 MCG/2ML IJ SOLN
25.0000 ug | INTRAMUSCULAR | Status: DC | PRN
  Administered 2020-08-14: 50 ug via INTRAVENOUS

## 2020-08-14 MED ORDER — DEXAMETHASONE SODIUM PHOSPHATE 10 MG/ML IJ SOLN
INTRAMUSCULAR | Status: AC
  Filled 2020-08-14: qty 1

## 2020-08-14 MED ORDER — HYDROMORPHONE HCL 1 MG/ML IJ SOLN
INTRAMUSCULAR | Status: AC
  Filled 2020-08-14: qty 1

## 2020-08-14 MED ORDER — OXYCODONE HCL 5 MG/5ML PO SOLN: 5.0000 mg | Freq: Once | ORAL | Status: DC | PRN

## 2020-08-14 MED ORDER — LACTATED RINGERS IV BOLUS
250.0000 mL | Freq: Once | INTRAVENOUS | Status: AC
  Administered 2020-08-14: 250 mL via INTRAVENOUS

## 2020-08-14 MED ORDER — ACETAMINOPHEN 325 MG PO TABS: 325.0000 mg | ORAL_TABLET | Freq: Four times a day (QID) | ORAL | Status: DC | PRN

## 2020-08-14 MED ORDER — POVIDONE-IODINE 10 % EX SWAB
2.0000 "application " | Freq: Once | CUTANEOUS | Status: AC
  Administered 2020-08-14: 2 via TOPICAL

## 2020-08-14 MED ORDER — ONDANSETRON HCL 4 MG/2ML IJ SOLN
INTRAMUSCULAR | Status: DC | PRN
  Administered 2020-08-14: 4 mg via INTRAVENOUS

## 2020-08-14 MED ORDER — PROPOFOL 1000 MG/100ML IV EMUL
INTRAVENOUS | Status: AC
  Filled 2020-08-14: qty 100

## 2020-08-14 MED ORDER — METOCLOPRAMIDE HCL 5 MG PO TABS
5.0000 mg | ORAL_TABLET | Freq: Three times a day (TID) | ORAL | Status: DC | PRN
  Filled 2020-08-14: qty 2

## 2020-08-14 MED ORDER — METHOCARBAMOL 500 MG IVPB - SIMPLE MED
500.0000 mg | Freq: Four times a day (QID) | INTRAVENOUS | Status: DC | PRN
  Administered 2020-08-14: 500 mg via INTRAVENOUS

## 2020-08-14 MED ORDER — VANCOMYCIN HCL IN DEXTROSE 1-5 GM/200ML-% IV SOLN
1000.0000 mg | INTRAVENOUS | Status: AC
  Administered 2020-08-14: 1000 mg via INTRAVENOUS

## 2020-08-14 MED ORDER — BUPIVACAINE-EPINEPHRINE 0.25% -1:200000 IJ SOLN
INTRAMUSCULAR | Status: DC | PRN
  Administered 2020-08-14: 30 mL

## 2020-08-14 MED ORDER — METHOCARBAMOL 500 MG IVPB - SIMPLE MED
INTRAVENOUS | Status: AC
  Filled 2020-08-14: qty 50

## 2020-08-14 MED ORDER — ONDANSETRON HCL 4 MG/2ML IJ SOLN
INTRAMUSCULAR | Status: AC
  Filled 2020-08-14: qty 2

## 2020-08-14 MED ORDER — ACETAMINOPHEN 500 MG PO TABS: 1000.0000 mg | ORAL_TABLET | Freq: Four times a day (QID) | ORAL | Status: DC

## 2020-08-14 MED ORDER — METHOCARBAMOL 500 MG PO TABS: 500.0000 mg | ORAL_TABLET | Freq: Four times a day (QID) | ORAL | Status: DC | PRN

## 2020-08-14 MED ORDER — PROMETHAZINE HCL 25 MG/ML IJ SOLN: 6.2500 mg | INTRAMUSCULAR | Status: DC | PRN

## 2020-08-14 MED ORDER — MIDAZOLAM HCL 5 MG/5ML IJ SOLN
INTRAMUSCULAR | Status: DC | PRN
  Administered 2020-08-14: 2 mg via INTRAVENOUS

## 2020-08-14 MED ORDER — OXYCODONE HCL 5 MG PO TABS: 5.0000 mg | ORAL_TABLET | Freq: Once | ORAL | Status: DC | PRN

## 2020-08-14 MED ORDER — ONDANSETRON HCL 4 MG/2ML IJ SOLN: 4.0000 mg | Freq: Four times a day (QID) | INTRAMUSCULAR | Status: DC | PRN

## 2020-08-14 MED ORDER — METOCLOPRAMIDE HCL 5 MG/ML IJ SOLN: 5.0000 mg | Freq: Three times a day (TID) | INTRAMUSCULAR | Status: DC | PRN

## 2020-08-14 MED ORDER — OXYCODONE HCL 5 MG PO TABS: 5.0000 mg | ORAL_TABLET | ORAL | Status: DC | PRN

## 2020-08-14 MED ORDER — PROPOFOL 10 MG/ML IV BOLUS
INTRAVENOUS | Status: DC | PRN
  Administered 2020-08-14 (×2): 30 mg via INTRAVENOUS

## 2020-08-14 MED ORDER — PHENYLEPHRINE HCL (PRESSORS) 10 MG/ML IV SOLN
INTRAVENOUS | Status: AC
  Filled 2020-08-14: qty 2

## 2020-08-14 MED ORDER — MIDAZOLAM HCL 2 MG/2ML IJ SOLN
INTRAMUSCULAR | Status: AC
  Filled 2020-08-14: qty 2

## 2020-08-14 MED ORDER — TRANEXAMIC ACID-NACL 1000-0.7 MG/100ML-% IV SOLN
1000.0000 mg | INTRAVENOUS | Status: AC
  Administered 2020-08-14: 1000 mg via INTRAVENOUS
  Filled 2020-08-14: qty 100

## 2020-08-14 MED ORDER — HYDROMORPHONE HCL 1 MG/ML IJ SOLN
0.5000 mg | INTRAMUSCULAR | Status: AC | PRN
  Administered 2020-08-14 (×2): 0.5 mg via INTRAVENOUS

## 2020-08-14 MED ORDER — SODIUM CHLORIDE 0.9% FLUSH
INTRAVENOUS | Status: DC | PRN
  Administered 2020-08-14: 50 mL

## 2020-08-14 MED ORDER — ONDANSETRON HCL 4 MG PO TABS
4.0000 mg | ORAL_TABLET | Freq: Four times a day (QID) | ORAL | Status: DC | PRN
  Filled 2020-08-14: qty 1

## 2020-08-14 MED ORDER — DEXAMETHASONE SODIUM PHOSPHATE 10 MG/ML IJ SOLN
INTRAMUSCULAR | Status: DC | PRN
  Administered 2020-08-14: 5 mg via INTRAVENOUS

## 2020-08-14 MED ORDER — BUPIVACAINE IN DEXTROSE 0.75-8.25 % IT SOLN
INTRATHECAL | Status: DC | PRN
  Administered 2020-08-14: 1.8 mL via INTRATHECAL

## 2020-08-14 MED ORDER — TIZANIDINE HCL 2 MG PO TABS: 2.0000 mg | ORAL_TABLET | Freq: Four times a day (QID) | ORAL | 0 refills | Status: DC | PRN

## 2020-08-14 MED ORDER — 0.9 % SODIUM CHLORIDE (POUR BTL) OPTIME
TOPICAL | Status: DC | PRN
  Administered 2020-08-14: 1000 mL

## 2020-08-14 MED ORDER — OXYCODONE HCL 5 MG PO TABS
ORAL_TABLET | ORAL | Status: AC
  Administered 2020-08-14: 10 mg via ORAL
  Filled 2020-08-14: qty 2

## 2020-08-14 MED ORDER — LACTATED RINGERS IV BOLUS
500.0000 mL | Freq: Once | INTRAVENOUS | Status: AC
  Administered 2020-08-14: 500 mL via INTRAVENOUS

## 2020-08-14 MED ORDER — ORAL CARE MOUTH RINSE
15.0000 mL | Freq: Once | OROMUCOSAL | Status: AC
  Administered 2020-08-14: 15 mL via OROMUCOSAL

## 2020-08-14 MED ORDER — PHENYLEPHRINE 40 MCG/ML (10ML) SYRINGE FOR IV PUSH (FOR BLOOD PRESSURE SUPPORT)
PREFILLED_SYRINGE | INTRAVENOUS | Status: AC
  Filled 2020-08-14: qty 10

## 2020-08-14 MED ORDER — FENTANYL CITRATE (PF) 100 MCG/2ML IJ SOLN
INTRAMUSCULAR | Status: AC
  Filled 2020-08-14: qty 2

## 2020-08-14 MED ORDER — OXYCODONE HCL 5 MG PO TABS: 10.0000 mg | ORAL_TABLET | ORAL | Status: DC | PRN

## 2020-08-14 SURGICAL SUPPLY — 48 items
BAG DECANTER FOR FLEXI CONT (MISCELLANEOUS) ×3 IMPLANT
BLADE SAW SGTL 18X1.27X75 (BLADE) ×2 IMPLANT
BLADE SURG SZ10 CARB STEEL (BLADE) ×4 IMPLANT
CNTNR URN SCR LID CUP LEK RST (MISCELLANEOUS) ×1 IMPLANT
CONT SPEC 4OZ STRL OR WHT (MISCELLANEOUS) ×2
COVER PERINEAL POST (MISCELLANEOUS) ×2 IMPLANT
COVER SURGICAL LIGHT HANDLE (MISCELLANEOUS) ×2 IMPLANT
COVER WAND RF STERILE (DRAPES) ×2 IMPLANT
CUP ACETBLR 48 OD 100 SERIES (Hips) ×1 IMPLANT
DECANTER SPIKE VIAL GLASS SM (MISCELLANEOUS) ×2 IMPLANT
DRAPE ORTHO SPLIT 77X108 STRL (DRAPES)
DRAPE STERI IOBAN 125X83 (DRAPES) ×2 IMPLANT
DRAPE SURG ORHT 6 SPLT 77X108 (DRAPES) IMPLANT
DRAPE U-SHAPE 47X51 STRL (DRAPES) ×4 IMPLANT
DRSG AQUACEL AG ADV 3.5X10 (GAUZE/BANDAGES/DRESSINGS) ×2 IMPLANT
DURAPREP 26ML APPLICATOR (WOUND CARE) ×2 IMPLANT
ELECT BLADE TIP CTD 4 INCH (ELECTRODE) ×2 IMPLANT
ELECT REM PT RETURN 15FT ADLT (MISCELLANEOUS) ×2 IMPLANT
ELIMINATOR HOLE APEX DEPUY (Hips) ×1 IMPLANT
GLOVE BIO SURGEON STRL SZ8.5 (GLOVE) ×2 IMPLANT
GLOVE SRG 8 PF TXTR STRL LF DI (GLOVE) ×1 IMPLANT
GLOVE SURG ENC MOIS LTX SZ7.5 (GLOVE) ×2 IMPLANT
GLOVE SURG UNDER POLY LF SZ8 (GLOVE) ×2
GLOVE SURG UNDER POLY LF SZ9 (GLOVE) ×2 IMPLANT
GOWN STRL REUS W/TWL XL LVL3 (GOWN DISPOSABLE) ×4 IMPLANT
HEAD FEMORAL 32 CERAMIC (Hips) ×1 IMPLANT
HOLDER FOLEY CATH W/STRAP (MISCELLANEOUS) ×1 IMPLANT
KIT TURNOVER KIT A (KITS) ×2 IMPLANT
MANIFOLD NEPTUNE II (INSTRUMENTS) ×2 IMPLANT
NDL HYPO 21X1.5 SAFETY (NEEDLE) ×2 IMPLANT
NEEDLE HYPO 21X1.5 SAFETY (NEEDLE) ×4 IMPLANT
NS IRRIG 1000ML POUR BTL (IV SOLUTION) ×3 IMPLANT
PACK ANTERIOR HIP CUSTOM (KITS) ×2 IMPLANT
PENCIL SMOKE EVACUATOR (MISCELLANEOUS) ×1 IMPLANT
PINN ALTRX NEUT ID X OD 32X48 ×1 IMPLANT
STEM FEM ACTIS STD SZ4 (Stem) ×1 IMPLANT
SUT ETHIBOND NAB CT1 #1 30IN (SUTURE) ×2 IMPLANT
SUT VIC AB 0 CT1 27 (SUTURE)
SUT VIC AB 0 CT1 27XBRD ANBCTR (SUTURE) IMPLANT
SUT VIC AB 1 CTX 36 (SUTURE) ×2
SUT VIC AB 1 CTX36XBRD ANBCTR (SUTURE) ×1 IMPLANT
SUT VIC AB 2-0 CT1 27 (SUTURE)
SUT VIC AB 2-0 CT1 TAPERPNT 27 (SUTURE) IMPLANT
SUT VIC AB 3-0 CT1 27 (SUTURE) ×2
SUT VIC AB 3-0 CT1 TAPERPNT 27 (SUTURE) ×1 IMPLANT
SYR CONTROL 10ML LL (SYRINGE) ×6 IMPLANT
TRAY CATH INTERMITTENT SS 16FR (CATHETERS) ×1 IMPLANT
TRAY FOLEY MTR SLVR 16FR STAT (SET/KITS/TRAYS/PACK) IMPLANT

## 2020-08-14 NOTE — Op Note (Signed)
PATIENT ID:      Amy LOISELLE  MRN:     [id number] DOB/AGE:    02-Oct-1975 / 45 y.o.  OPERATIVE REPORT   DATE OF PROCEDURE:  08/14/2020      PREOPERATIVE DIAGNOSIS:  RIGHT HIP OSTEOARTHRITIS                                                         POSTOPERATIVE DIAGNOSIS:  Same                                                         PROCEDURE: Anterior R total hip arthroplasty using a 48 mm DePuy Pinnacle  Cup, [employer], 0-degree polyethylene liner, a +1 mm x 72mm ceramic head, a 4 std Depuy Actis stem  SURGEON: Kerin Salen  ASSISTANT:   Kerry Hough. [employer]  (present throughout entire procedure and necessary for timely completion of the procedure)   ANESTHESIA: Spinal, Exparel 133mg  injection BLOOD LOSS: 300 cc FLUID REPLACEMENT: 1700 cc crystalloid TRANEXAMIC ACID: 1gm IV, 2gm Topical COMPLICATIONS: none    INDICATIONS FOR PROCEDURE: A 45 y.o. year-old With  RIGHT HIP OSTEOARTHRITIS   for 2 years, x-rays show bone-on-bone arthritic changes, and osteophytes. Despite conservative measures with observation, anti-inflammatory medicine, narcotics, use of a cane, has severe unremitting pain and can ambulate only a few blocks before resting. Patient desires elective R total hip arthroplasty to decrease pain and increase function. The risks, benefits, and alternatives were discussed at length including but not limited to the risks of infection, bleeding, nerve injury, stiffness, blood clots, the need for revision surgery, cardiopulmonary complications, among others, and they were willing to proceed. Questions answered      PROCEDURE IN DETAIL: The patient was identified by armband,   received preoperative IV antibiotics in the holding area at [facility], taken to the operating room , appropriate anesthetic monitors   were attached and anesthesia was induced with the patient on the gurney. HANA boots were applied to the feet, and the patient  was transferred to the HANA  table with a peroneal post and support underneath the non-operative leg. Theoperative lower extremity was then prepped and draped in the usual sterile fashion from just above the iliac crest to the knee. And a timeout procedure was performed. Kerry Hough. Hardin Negus [facility] was present and scrubbed throughout the case, critical for assistance with, positioning, exposure, retraction, instrumentation, and closure.Skin along incision area was injected with 10 cc of Exparel solution. We then made a 13 cm incision along the interval at the leading edge of the tensor fascia lata of starting at 2 cm lateral to the ASIS. Small bleeders in the skin and subcutaneous tissue identified and cauterized we dissected down to the fascia and made an incision in the fascia allowing [place] to elevate the fascia of the tensor muscle and exploited the interval between the rectus and the tensor fascia lata. A Cobra retractor was then placed along the superior neck of the femur. A cerebellar retractor was used to expose the interval between the tensor fascia lata and the rectus femoris.  We identified  and cauterized the ascending branch of the anterior circumflex artery. A second Cobra retractor along the inferior neck of the femur. A small Hohmann retractor was placed underneath the origin of the rectus femoris, giving [place] good medial exposure. Using Ronguers fatty tissue was removed from in front of the anterior capsule. The capsule was then incised, starting out at the superior anterior rim of the acetabulum going laterally along the anterior neck. The capsule was then teed along the neck superiorly and inferiorly. Electrocautery was used to release capsule from the anterior and medial neck of the femur to allow external rotation. Cobra retractors were then placed along the inferior and superior neck allowing [place] to perform a standard neck cut and removed the femoral head with a power corkscrew. We then placed a medium bent homan retractor in the  cotyloid notch and posteriorly along the acetabular rim a narrow Cobra retractor. Exposed labral tissue and osteophytes were then removed. We then sequentially reamed up to a 47 mm basket reamer obtaining good coverage in all quadrants, verified by C-arm imaging. Under C-arm control we then hammered into place a 48 mm Pinnacle cup in 45 of abduction and 15 of anteversion. The cup seated nicely and required no supplemental screws. We then placed a central hole Eliminator and a 0 polyethylene liner. The foot was then externally rotated to 130-140. The limb was extended and adducted to the floor, delivering the proximal femur up into the wound. A medium curved Hohmann retractor was placed over the greater trochanter and a long Homan retractor along the posterior femoral neck completing the exposure and lateralizing the femur. We then performed releases superiorly and and inferiorly of the capsule going back to the pirformis fossa superiorly and to the lesser trochanter inferiorly. We then entered the proximal femur with the box cutting offset chisel followed by, a canal sounder, the chili pepper and broaching up to a 4 broach. This seated nicely and we reamed the calcar. A trial reduction was performed with a 1 mm X 32 mm head.The limb lengths were excellent the hip was stable in 90 of external rotation. At this point the trial components removed and we hammered into place a # 4 std  Offset Actis stem with Gryption coating. A + 1 mm x 32 head was then hammered into place. The hip was reduced and final C-arm images obtained. The wound was thoroughly irrigated with normal saline solution. We repaired the ant capsule and the tensor fascia lot a with running 0 vicryl suture. the subcutaneous tissue was closed with 2-0 and 3-0 Vicryl suture followed by an Aquacil dressing. At this point the patient was awaken and transferred to hospital gurney without difficulty.   Kerin Salen 08/14/2020, 8:45 AM

## 2020-08-14 NOTE — Evaluation (Signed)
Physical Therapy Evaluation Patient Details Name: Amy Lee MRN: [id number] DOB: 09-17-1975 Today's Date: 08/14/2020   History of Present Illness  45 yo female s/p R THA-direct anterior 08/14/20.  Clinical Impression  On eval POD 0, pt was Min guard for mobility. She walked ~75 feet with a RW. Moderate pain with activity. Reviewed/practiced exercises and gait training. Pt does not have any stairs to negotiate. Pt was unsure if she was to have any f/u PT so I issued a HEP for her to perform 1-2x/day as tolerated. Pt is still experiencing some buttock and genital area numbness-had 2 episodes of bladder incontinence-RN made aware. Okay to d/c from PT standpoint.     Follow Up Recommendations Follow surgeon's recommendation for DC plan and follow-up therapies;Supervision for mobility/OOB    Equipment Recommendations  Rolling walker with 5" wheels    Recommendations for Other Services       Precautions / Restrictions Precautions Precautions: Fall Restrictions Weight Bearing Restrictions: Yes Other Position/Activity Restrictions: WBAT      Mobility  Bed Mobility Overal bed mobility: Needs Assistance Bed Mobility: Supine to Sit     Supine to sit: Supervision;HOB elevated     General bed mobility comments: for safety. cues for safety, technique    Transfers Overall transfer level: Needs assistance Equipment used: Rolling walker (2 wheeled) Transfers: Sit to/from [facility] Sit to Stand: Min guard Stand pivot transfers: Min guard       General transfer comment: Min guard for safety. Cues for safety, hand placement. Stand pivot, stretcher to bsc, to toilet-bladder incontinence with standing  Ambulation/Gait Ambulation/Gait assistance: Min guard Gait Distance (Feet): 75 Feet Assistive device: Rolling walker (2 wheeled) Gait Pattern/deviations: Step-to pattern     General Gait Details: Min guard for safety. Cues for safety, sequence. RW proximity. No  LOB or buckling with RW use. Pt denied dizziness.  Stairs            Wheelchair Mobility    Modified Rankin (Stroke Patients Only)       Balance Overall balance assessment: Needs assistance         Standing balance support: Bilateral upper extremity supported Standing balance-Leahy Scale: Fair                               Pertinent Vitals/Pain Pain Assessment: 0-10 Pain Score: 7  Pain Location: R hip/thigh Pain Descriptors / Indicators: Discomfort;Sore;Aching Pain Intervention(s): Monitored during session;Repositioned;Ice applied    Home Living Family/patient expects to be discharged to:: Private residence Living Arrangements: Spouse/significant other Available Help at Discharge: Family Type of Home: House Home Access: Ramped entrance     [place]: Able to live on main level with bedroom/bathroom Home Equipment: None      Prior Function Level of Independence: Independent               Hand Dominance        Extremity/Trunk Assessment   Upper Extremity Assessment Upper Extremity Assessment: Overall WFL for tasks assessed    Lower Extremity Assessment Lower Extremity Assessment: Generalized weakness    Cervical / Trunk Assessment Cervical / Trunk Assessment: Normal  Communication   Communication: No difficulties  Cognition Arousal/Alertness: Awake/alert Behavior During Therapy: WFL for tasks assessed/performed Overall Cognitive Status: Within Functional Limits for tasks assessed  General Comments      Exercises Total Joint Exercises Ankle Circles/Pumps: AROM;Both;10 reps Quad Sets: AROM;Both;10 reps Heel Slides: AAROM;Right;10 reps Hip ABduction/ADduction: AAROM;Right;10 reps   Assessment/Plan    PT Assessment Patient needs continued PT services  PT Problem List Decreased strength;Decreased mobility;Decreased range of motion;Decreased activity  tolerance;Decreased balance;Pain       PT Treatment Interventions DME instruction;Gait training;Therapeutic activities;Patient/family education;Therapeutic exercise;Balance training;Functional mobility training    PT Goals (Current goals can be found in the Care Plan section)  Acute Rehab PT Goals Patient Stated Goal: home. regain PLOF. less pain. PT Goal Formulation: All assessment and education complete, DC therapy    Frequency 7X/week   Barriers to discharge        Co-evaluation               AM-PAC PT "6 Clicks" Mobility  Outcome Measure Help needed turning from your back to your side while in a flat bed without using bedrails?: A Little Help needed moving from lying on your back to sitting on the side of a flat bed without using bedrails?: A Little Help needed moving to and from a bed to a chair (including a wheelchair)?: A Little Help needed standing up from a chair using your arms (e.g., wheelchair or bedside chair)?: A Little Help needed to walk in hospital room?: A Little Help needed climbing 3-5 steps with a railing? : A Little 6 Click Score: 18    End of Session Equipment Utilized During Treatment: Gait belt Activity Tolerance: Patient tolerated treatment well Patient left: in chair;with call bell/phone within reach   PT Visit Diagnosis: Pain;Other abnormalities of gait and mobility (R26.89) Pain - Right/Left: Right Pain - part of body: Hip    Time: 2820-6015 PT Time Calculation (min) (ACUTE ONLY): 43 min   Charges:   PT Evaluation $PT Eval Low Complexity: 1 Low PT Treatments $Gait Training: 23-37 mins         Doreatha Massed, PT Acute Rehabilitation  Office: [phone] Pager: [phone]

## 2020-08-14 NOTE — Anesthesia Procedure Notes (Signed)
Procedure Name: MAC Date/Time: 08/14/2020 10:04 AM Performed by: West Pugh, CRNA Pre-anesthesia Checklist: Patient identified, Emergency Drugs available, Suction available, Patient being monitored and Timeout performed Patient Re-evaluated:Patient Re-evaluated prior to induction Oxygen Delivery Method: Simple face mask Preoxygenation: Pre-oxygenation with 100% oxygen Induction Type: IV induction Placement Confirmation: positive ETCO2 Dental Injury: Teeth and Oropharynx as per pre-operative assessment

## 2020-08-14 NOTE — Anesthesia Procedure Notes (Signed)
Spinal  Patient location during procedure: OR Start time: 08/14/2020 10:07 AM End time: 08/14/2020 10:14 AM Staffing Performed: anesthesiologist  Anesthesiologist: Audry Pili, MD Preanesthetic Checklist Completed: patient identified, IV checked, risks and benefits discussed, surgical consent, monitors and equipment checked, pre-op evaluation and timeout performed Spinal Block Patient position: sitting Prep: DuraPrep Patient monitoring: heart rate, cardiac monitor, continuous pulse ox and blood pressure Approach: midline Location: L2-3 Injection technique: single-shot Needle Needle type: Quincke  Needle gauge: 22 G Additional Notes Consent was obtained prior to the procedure with all questions answered and concerns addressed. Risks including, but not limited to, bleeding, infection, nerve damage, paralysis, failed block, inadequate analgesia, allergic reaction, high spinal, itching, and headache were discussed and the patient wished to proceed. Functioning IV was confirmed and monitors were applied. Sterile prep and drape, including hand hygiene, mask, and sterile gloves were used. The patient was positioned and the spine was prepped. The skin was anesthetized with lidocaine. First attempt by CRNA unsuccessful. 2nd attempt by Dr. Fransisco Beau successful. Free flow of clear CSF was obtained prior to injecting local anesthetic into the CSF. The spinal needle aspirated freely following injection. The needle was carefully withdrawn. The patient tolerated the procedure well.   Renold Don, MD

## 2020-08-14 NOTE — Anesthesia Preprocedure Evaluation (Addendum)
Anesthesia Evaluation  Patient identified by MRN, date of birth, ID band Patient awake    Reviewed: Allergy & Precautions, NPO status , Patient's Chart, lab work & pertinent test results  History of Anesthesia Complications Negative for: history of anesthetic complications  Airway Mallampati: II  TM Distance: >3 FB Neck ROM: Full    Dental  (+) Dental Advisory Given, Caps   Pulmonary neg pulmonary ROS,    Pulmonary exam normal        Cardiovascular negative cardio ROS Normal cardiovascular exam     Neuro/Psych  Headaches, negative psych ROS   GI/Hepatic negative GI ROS, Neg liver ROS,   Endo/Other  negative endocrine ROS  Renal/GU negative Renal ROS     Musculoskeletal  (+) Arthritis ,   Abdominal   Peds  Hematology negative hematology ROS (+)   Anesthesia Other Findings Covid test negative   Reproductive/Obstetrics  S/p b/l salpingectomy, hysterectomy                              Anesthesia Physical Anesthesia Plan  ASA: I  Anesthesia Plan: Spinal   Post-op Pain Management:    Induction:   PONV Risk Score and Plan: 2 and Treatment may vary due to age or medical condition and Propofol infusion  Airway Management Planned: Natural Airway and Simple Face Mask  Additional Equipment: None  Intra-op Plan:   Post-operative Plan:   Informed Consent: I have reviewed the patients History and Physical, chart, labs and discussed the procedure including the risks, benefits and alternatives for the proposed anesthesia with the patient or authorized representative who has indicated his/her understanding and acceptance.       Plan Discussed with: CRNA and Anesthesiologist  Anesthesia Plan Comments: (Labs reviewed, platelets acceptable. Discussed risks and benefits of spinal, including spinal/epidural hematoma, infection, failed block, and PDPH. Patient expressed understanding and  wished to proceed. )       Anesthesia Quick Evaluation

## 2020-08-14 NOTE — Discharge Instructions (Signed)

## 2020-08-14 NOTE — Interval H&P Note (Signed)
History and Physical Interval Note:  08/14/2020 8:45 AM  Amy Lee  has presented today for surgery, with the diagnosis of RIGHT HIP OSTEOARTHRITIS.  The various methods of treatment have been discussed with the patient and family. After consideration of risks, benefits and other options for treatment, the patient has consented to  Procedure(s): RIGHT TOTAL HIP ARTHROPLASTY ANTERIOR APPROACH (Right) as a surgical intervention.  The patient's history has been reviewed, patient examined, no change in status, stable for surgery.  I have reviewed the patient's chart and labs.  Questions were answered to the patient's satisfaction.     Kerin Salen

## 2020-08-14 NOTE — Transfer of Care (Signed)
Immediate Anesthesia Transfer of Care Note  Patient: Amy Lee  Procedure(s) Performed: RIGHT TOTAL HIP ARTHROPLASTY ANTERIOR APPROACH (Right Hip)  Patient Location: PACU  Anesthesia Type:MAC and Spinal  Level of Consciousness: awake, drowsy and patient cooperative  Airway & Oxygen Therapy: Patient Spontanous Breathing and Patient connected to face mask oxygen  Post-op Assessment: Report given to RN and Post -op Vital signs reviewed and stable  Post vital signs: Reviewed and stable  Last Vitals:  Vitals Value Taken Time  BP    Temp    Pulse    Resp    SpO2      Last Pain:  Vitals:   08/14/20 0810  TempSrc: Oral         Complications: No complications documented.

## 2020-08-15 ENCOUNTER — Encounter (HOSPITAL_COMMUNITY): Payer: Self-pay | Admitting: Orthopedic Surgery

## 2020-08-15 NOTE — Anesthesia Postprocedure Evaluation (Signed)
Anesthesia Post Note  Patient: Amy Lee  Procedure(s) Performed: RIGHT TOTAL HIP ARTHROPLASTY ANTERIOR APPROACH (Right Hip)     Patient location during evaluation: PACU Anesthesia Type: Spinal Level of consciousness: awake and alert Pain management: pain level controlled Vital Signs Assessment: post-procedure vital signs reviewed and stable Respiratory status: spontaneous breathing and respiratory function stable Cardiovascular status: blood pressure returned to baseline and stable Postop Assessment: spinal receding and no apparent nausea or vomiting Anesthetic complications: no   No complications documented.  Last Vitals:  Vitals:   08/14/20 1634 08/14/20 1700  BP: 111/70 110/75  Pulse: 74 85  Resp:  16  Temp:  36.8 C  SpO2: 100% 100%    Last Pain:  Vitals:   08/14/20 1700  TempSrc:   PainSc: [place]

## 2021-03-31 HISTORY — PX: ABDOMINOPLASTY: SUR9

## 2021-06-04 ENCOUNTER — Ambulatory Visit (HOSPITAL_BASED_OUTPATIENT_CLINIC_OR_DEPARTMENT_OTHER): Payer: 59 | Admitting: Obstetrics & Gynecology

## 2021-07-31 ENCOUNTER — Encounter (HOSPITAL_BASED_OUTPATIENT_CLINIC_OR_DEPARTMENT_OTHER): Payer: Self-pay | Admitting: Radiology

## 2021-07-31 ENCOUNTER — Other Ambulatory Visit: Payer: Self-pay

## 2021-07-31 ENCOUNTER — Other Ambulatory Visit (HOSPITAL_COMMUNITY)
Admission: RE | Admit: 2021-07-31 | Discharge: 2021-07-31 | Disposition: A | Discharge status: Home / Self Care | Payer: 59 | Source: Ambulatory Visit | Attending: Obstetrics & Gynecology | Admitting: Obstetrics & Gynecology

## 2021-07-31 ENCOUNTER — Ambulatory Visit (HOSPITAL_BASED_OUTPATIENT_CLINIC_OR_DEPARTMENT_OTHER): Payer: 59 | Admitting: Obstetrics & Gynecology

## 2021-07-31 ENCOUNTER — Encounter (HOSPITAL_BASED_OUTPATIENT_CLINIC_OR_DEPARTMENT_OTHER): Payer: Self-pay | Admitting: Obstetrics & Gynecology

## 2021-07-31 ENCOUNTER — Ambulatory Visit (HOSPITAL_BASED_OUTPATIENT_CLINIC_OR_DEPARTMENT_OTHER)
Admission: RE | Admit: 2021-07-31 | Discharge: 2021-07-31 | Disposition: A | Discharge status: Home / Self Care | Payer: 59 | Source: Ambulatory Visit | Attending: Obstetrics & Gynecology | Admitting: Obstetrics & Gynecology

## 2021-07-31 VITALS — Ht 64.5 in | Wt 174.8 lb

## 2021-07-31 DIAGNOSIS — Z124 Encounter for screening for malignant neoplasm of cervix: Secondary | ICD-10-CM

## 2021-07-31 DIAGNOSIS — Z01419 Encounter for gynecological examination (general) (routine) without abnormal findings: Secondary | ICD-10-CM

## 2021-07-31 DIAGNOSIS — Z1231 Encounter for screening mammogram for malignant neoplasm of breast: Secondary | ICD-10-CM

## 2021-07-31 DIAGNOSIS — Z1211 Encounter for screening for malignant neoplasm of colon: Secondary | ICD-10-CM

## 2021-07-31 DIAGNOSIS — R232 Flushing: Secondary | ICD-10-CM

## 2021-07-31 DIAGNOSIS — Z90710 Acquired absence of both cervix and uterus: Secondary | ICD-10-CM | POA: Diagnosis not present

## 2021-07-31 NOTE — Progress Notes (Signed)
46 y.o. [id number] Divorced White or Caucasian female here for annual exam/new patient exam.  Former patient.  H/o LAVH.  Denies vaginal bleeding.  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  Engaged and in stable relationship.  Does have hot flashes.  Would like to know where she is in regards to menopause.  Patient's last menstrual period was 07/01/2009.          Sexually active: Yes.    The current method of family planning is status post hysterectomy.    Smoker:  no  Health Maintenance: Pap:  2018 History of abnormal Pap:  yes MMG:  has not done one Colonoscopy:  guidelines reviewed.  Pt willing to proceed with cologuard Screening Labs: orders below   reports that she has never smoked. She has never used smokeless tobacco. She reports current alcohol use. She reports that she does not use drugs.  Past Medical History:  Diagnosis Date   Arthritis    Headache    History of abnormal cervical Pap smear 2008   LGSIL   Pneumonia 2022   Shingles 2022    Past Surgical History:  Procedure Laterality Date   ABDOMINOPLASTY  03/2021   BILATERAL SALPINGECTOMY Bilateral 05/16/2014   Procedure: BILATERAL SALPINGECTOMY;  Surgeon: Lyman Speller, MD;  Location: [facility];  Service: Gynecology;  Laterality: Bilateral;   CHOLECYSTECTOMY     CYSTOSCOPY N/A 05/16/2014   Procedure: CYSTOSCOPY;  Surgeon: Lyman Speller, MD;  Location: [facility];  Service: Gynecology;  Laterality: N/A;   Ileocolonoscopy  10/19/2007   Normal terminal ileum approximately 10-20 cm visualized/ Normal colon / Large internal hemorrhoids/ nl colon. Melanosis coli   INTRAUTERINE DEVICE INSERTION  09/2009   Mirena   IUD REMOVAL N/A 05/16/2014   Procedure: INTRAUTERINE DEVICE (IUD) REMOVAL;  Surgeon: Lyman Speller, MD;  Location: [facility];  Service: Gynecology;  Laterality: N/A;   LAPAROSCOPIC HYSTERECTOMY N/A 05/16/2014   Procedure: HYSTERECTOMY TOTAL LAPAROSCOPIC;  Surgeon: Lyman Speller, MD;  Location: [facility];  Service: Gynecology;  Laterality: N/A;   TOTAL HIP ARTHROPLASTY Right 08/14/2020   Procedure: RIGHT TOTAL HIP ARTHROPLASTY ANTERIOR APPROACH;  Surgeon: Frederik Pear, MD;  Location: [facility];  Service: Orthopedics;  Laterality: Right;    No current outpatient medications on file.   No current facility-administered medications for this visit.    Family History  Problem Relation Age of Onset   Diabetes Mother    Hyperlipidemia Father    Lung cancer Maternal Grandmother    Cancer Paternal Grandmother 40       ovarian that was metastatic to colon   Colon cancer Paternal Grandmother    Thyroid disease Paternal Grandmother    Heart failure Paternal Grandfather    Lung cancer Maternal Aunt     Review of Systems  All other systems reviewed and are negative.  Exam:   Ht 5' 4.5" (1.638 m)    Wt 174 lb 12.8 oz (79.3 kg)    LMP 07/01/2009    BMI 29.54 kg/m   Height: 5' 4.5" (163.8 cm)  General appearance: alert, cooperative and appears stated age Head: Normocephalic, without obvious abnormality, atraumatic Neck: no adenopathy, supple, symmetrical, trachea midline and thyroid normal to inspection and palpation Lungs: clear to auscultation bilaterally Breasts: normal appearance, no masses or tenderness Heart: regular rate and rhythm Abdomen: soft, non-tender; bowel sounds normal; no masses,  no organomegaly Extremities: extremities normal, atraumatic, no cyanosis or edema Skin: Skin color, texture, turgor normal. No rashes or  lesions Lymph nodes: Cervical, supraclavicular, and axillary nodes normal. No abnormal inguinal nodes palpated Neurologic: Grossly normal   Pelvic: External genitalia:  no lesions              Urethra:  normal appearing urethra with no masses, tenderness or lesions              Bartholins and Skenes: normal                 Vagina: normal appearing vagina with normal color and no discharge, no lesions              Cervix: absent              Pap taken: Yes.    Bimanual Exam:  Uterus:  uterus absent              Adnexa: no mass, fullness, tenderness               Rectovaginal: Confirms               Anus:  normal sphincter tone, no lesions  Chaperone, Octaviano Batty, CMA, was present for exam.  Assessment/Plan: 1. Well woman exam with routine gynecological exam - pap and HR HPV obtained today - MMG ordered - cologuard ordered - vaccines reviewed/updated.  Pt aware Tdap due and to receive if has exposure.  2. Cervical cancer screening - Cytology - PAP( [place])  3. Hot flashes - Follicle stimulating hormone - Estradiol  4. Colon cancer screening - Cologuard  5. Encounter for screening mammogram for malignant neoplasm of breast - called radiology and this can be done today - MM 3D SCREEN BREAST BILATERAL; Future  6. History of LAVH

## 2021-08-01 LAB — ESTRADIOL: Estradiol: 61.5 pg/mL

## 2021-08-01 LAB — FOLLICLE STIMULATING HORMONE: FSH: 2.2 m[IU]/mL

## 2021-08-02 LAB — CYTOLOGY - PAP
Comment: NEGATIVE
Diagnosis: NEGATIVE
Diagnosis: REACTIVE
High risk HPV: NEGATIVE

## 2021-08-06 ENCOUNTER — Ambulatory Visit (HOSPITAL_BASED_OUTPATIENT_CLINIC_OR_DEPARTMENT_OTHER): Payer: 59 | Admitting: Obstetrics & Gynecology

## 2021-10-18 DIAGNOSIS — R0602 Shortness of breath: Secondary | ICD-10-CM | POA: Diagnosis not present

## 2021-10-18 DIAGNOSIS — R69 Illness, unspecified: Secondary | ICD-10-CM | POA: Diagnosis not present

## 2021-10-18 DIAGNOSIS — M545 Low back pain, unspecified: Secondary | ICD-10-CM | POA: Diagnosis not present

## 2021-10-18 DIAGNOSIS — R0789 Other chest pain: Secondary | ICD-10-CM | POA: Diagnosis not present

## 2021-10-18 DIAGNOSIS — R079 Chest pain, unspecified: Secondary | ICD-10-CM | POA: Diagnosis not present

## 2021-12-13 DIAGNOSIS — W230XXA Caught, crushed, jammed, or pinched between moving objects, initial encounter: Secondary | ICD-10-CM | POA: Diagnosis not present

## 2021-12-13 DIAGNOSIS — Z23 Encounter for immunization: Secondary | ICD-10-CM | POA: Diagnosis not present

## 2021-12-13 DIAGNOSIS — S61012A Laceration without foreign body of left thumb without damage to nail, initial encounter: Secondary | ICD-10-CM | POA: Diagnosis not present

## 2022-01-03 ENCOUNTER — Ambulatory Visit: Payer: 59 | Admitting: Surgery

## 2022-01-09 ENCOUNTER — Ambulatory Visit: Payer: 59 | Admitting: Surgery

## 2022-01-09 ENCOUNTER — Encounter: Payer: Self-pay | Admitting: Surgery

## 2022-01-09 VITALS — BP 111/77 | HR 85 | Temp 98.5°F | Resp 16 | Ht 64.5 in | Wt 166.0 lb

## 2022-01-09 DIAGNOSIS — L720 Epidermal cyst: Secondary | ICD-10-CM

## 2022-01-09 NOTE — Progress Notes (Signed)
[facility] History and Physical  Reason for Referral: Back cyst Referring Physician: Self referral  Chief Complaint   New Patient (Initial Visit)     Amy Lee is a 46 y.o. female.  HPI: Patient presents for evaluation of an enlarging back mass.  She previously had a chronic blackhead in this location on her back, which went away on its own.  She then started developing an increasing lump in the area.  6 weeks later, she presented to her primary care doctor's office, who performed an I&D on September 03, 2021.  Pus was evacuated at that time.  Since then, a knot Lelan Pons presented in May/June, and has been increasing in size.  She does confirm tenderness associated with the area.  She denies any current drainage, fevers, chills, nausea, and vomiting.  She denies history of cysts or abscesses elsewhere on her body.  Her surgical history is significant for cholecystectomy, hysterectomy, right hip replacement, and an abdominoplasty.  She denies use of blood thinning medications.  She is not currently taking any chronic medications.  She denies use of tobacco products and illicit drugs.  She occasionally will drink alcohol.  Past Medical History:  Diagnosis Date   Arthritis    Headache    History of abnormal cervical Pap smear 2008   LGSIL   Pneumonia 2022   Shingles 2022    Past Surgical History:  Procedure Laterality Date   ABDOMINOPLASTY  03/2021   BILATERAL SALPINGECTOMY Bilateral 05/16/2014   Procedure: BILATERAL SALPINGECTOMY;  Surgeon: Lyman Speller, MD;  Location: [facility];  Service: Gynecology;  Laterality: Bilateral;   CHOLECYSTECTOMY     CYSTOSCOPY N/A 05/16/2014   Procedure: CYSTOSCOPY;  Surgeon: Lyman Speller, MD;  Location: [facility];  Service: Gynecology;  Laterality: N/A;   Ileocolonoscopy  10/19/2007   Normal terminal ileum approximately 10-20 cm visualized/ Normal colon / Large internal hemorrhoids/ nl colon. Melanosis coli   INTRAUTERINE DEVICE  INSERTION  09/2009   Mirena   IUD REMOVAL N/A 05/16/2014   Procedure: INTRAUTERINE DEVICE (IUD) REMOVAL;  Surgeon: Lyman Speller, MD;  Location: [facility];  Service: Gynecology;  Laterality: N/A;   LAPAROSCOPIC HYSTERECTOMY N/A 05/16/2014   Procedure: HYSTERECTOMY TOTAL LAPAROSCOPIC;  Surgeon: Lyman Speller, MD;  Location: [facility];  Service: Gynecology;  Laterality: N/A;   TOTAL HIP ARTHROPLASTY Right 08/14/2020   Procedure: RIGHT TOTAL HIP ARTHROPLASTY ANTERIOR APPROACH;  Surgeon: Frederik Pear, MD;  Location: [facility];  Service: Orthopedics;  Laterality: Right;    Family History  Problem Relation Age of Onset   Diabetes Mother    Hyperlipidemia Father    Lung cancer Maternal Grandmother    Cancer Paternal Grandmother 61       ovarian that was metastatic to colon   Colon cancer Paternal Grandmother    Thyroid disease Paternal Grandmother    Heart failure Paternal Grandfather    Lung cancer Maternal Aunt     Social History   Tobacco Use   Smoking status: Never   Smokeless tobacco: Never  Vaping Use   Vaping Use: Never used  Substance Use Topics   Alcohol use: Yes    Comment: occ wine   Drug use: Never    Medications: I have reviewed the patient's current medications. Allergies as of 01/09/2022       Reactions   Ceclor [cefaclor] Rash   Fioricet-codeine [butalbital-apap-caff-cod] Rash   Stadol [butorphanol] Rash        Medication List  Accurate as of January 09, 2022 11:18 AM. If you have any questions, ask your nurse or doctor.          MOUNJARO [place] Inject into the skin.         ROS:  Constitutional: negative for chills, fatigue, and fevers Eyes: negative for visual disturbance and pain Ears, nose, mouth, throat, and face: negative for ear drainage, sore throat, and sinus problems Respiratory: negative for cough, wheezing, and shortness of breath Cardiovascular: negative for chest pain and palpitations Gastrointestinal: positive for reflux  symptoms, negative for abdominal pain, nausea, and vomiting Genitourinary:negative for dysuria, frequency, and urinary retention Integument/breast: negative for dryness and rash Hematologic/lymphatic: negative for bleeding and lymphadenopathy Musculoskeletal:negative for back pain, neck pain, and joint pain Neurological: negative for dizziness, tremors, and numbness Endocrine: negative for temperature intolerance  Blood pressure 111/77, pulse 85, temperature 98.5 F (36.9 C), temperature source Oral, resp. rate 16, height 5' 4.5" (1.638 m), weight 166 lb (75.3 kg), last menstrual period 07/01/2009, SpO2 97 %. Physical Exam Vitals reviewed.  Constitutional:      Appearance: Normal appearance.  HENT:     Head: Normocephalic and atraumatic.  Eyes:     Extraocular Movements: Extraocular movements intact.     Pupils: Pupils are equal, round, and reactive to light.  Cardiovascular:     Rate and Rhythm: Normal rate and regular rhythm.  Pulmonary:     Effort: Pulmonary effort is normal.     Breath sounds: Normal breath sounds.  Abdominal:     General: There is no distension.     Palpations: Abdomen is soft.     Tenderness: There is no abdominal tenderness.  Musculoskeletal:        General: Normal range of motion.     Cervical back: Normal range of motion.  Skin:    General: Skin is warm and dry.     Comments: Cyst of midline, upper back, 5 cm in size, overlying scar from previous I&D site, no erythema or induration  Neurological:     General: No focal deficit present.     Mental Status: She is alert and oriented to person, place, and time.  Psychiatric:        Mood and Affect: Mood normal.        Behavior: Behavior normal.     Results: No results found for this or any previous visit (from the past 48 hour(s)).  No results found.   Assessment & Plan:  Amy Lee is a 46 y.o. female who presents for back cyst excision.  -I explained the pathophysiology of epidermoid cyst.   Given that she has required previous I&D and it is causing her pain, recommend excision at this time -The risk and benefits of excision of back epidermoid cyst were discussed including but not limited to bleeding, infection, seroma formation, injury to surrounding structures, need for additional procedure, and wound breakdown.  After careful consideration, CARLINA DERKS has decided to proceed with epidermoid cyst excision. -Patient tentatively scheduled for surgery on 7/17 -Information provided to the patient regarding epidermoid cyst removal  All questions were answered to the satisfaction of the patient.   Graciella Freer, DO [facility] [address] [place], [place] [address] [phone] (office)

## 2022-01-09 NOTE — H&P (Signed)
[facility] History and Physical  Reason for Referral: Back cyst Referring Physician: Self referral  Chief Complaint   New Patient (Initial Visit)     Amy Lee is a 46 y.o. female.  HPI: Patient presents for evaluation of an enlarging back mass.  She previously had a chronic blackhead in this location on her back, which went away on its own.  She then started developing an increasing lump in the area.  6 weeks later, she presented to her primary care doctor's office, who performed an I&D on September 03, 2021.  Pus was evacuated at that time.  Since then, a knot Lelan Pons presented in May/June, and has been increasing in size.  She does confirm tenderness associated with the area.  She denies any current drainage, fevers, chills, nausea, and vomiting.  She denies history of cysts or abscesses elsewhere on her body.  Her surgical history is significant for cholecystectomy, hysterectomy, right hip replacement, and an abdominoplasty.  She denies use of blood thinning medications.  She is not currently taking any chronic medications.  She denies use of tobacco products and illicit drugs.  She occasionally will drink alcohol.  Past Medical History:  Diagnosis Date   Arthritis    Headache    History of abnormal cervical Pap smear 2008   LGSIL   Pneumonia 2022   Shingles 2022    Past Surgical History:  Procedure Laterality Date   ABDOMINOPLASTY  03/2021   BILATERAL SALPINGECTOMY Bilateral 05/16/2014   Procedure: BILATERAL SALPINGECTOMY;  Surgeon: Lyman Speller, MD;  Location: [facility];  Service: Gynecology;  Laterality: Bilateral;   CHOLECYSTECTOMY     CYSTOSCOPY N/A 05/16/2014   Procedure: CYSTOSCOPY;  Surgeon: Lyman Speller, MD;  Location: [facility];  Service: Gynecology;  Laterality: N/A;   Ileocolonoscopy  10/19/2007   Normal terminal ileum approximately 10-20 cm visualized/ Normal colon / Large internal hemorrhoids/ nl colon. Melanosis coli   INTRAUTERINE DEVICE  INSERTION  09/2009   Mirena   IUD REMOVAL N/A 05/16/2014   Procedure: INTRAUTERINE DEVICE (IUD) REMOVAL;  Surgeon: Lyman Speller, MD;  Location: [facility];  Service: Gynecology;  Laterality: N/A;   LAPAROSCOPIC HYSTERECTOMY N/A 05/16/2014   Procedure: HYSTERECTOMY TOTAL LAPAROSCOPIC;  Surgeon: Lyman Speller, MD;  Location: [facility];  Service: Gynecology;  Laterality: N/A;   TOTAL HIP ARTHROPLASTY Right 08/14/2020   Procedure: RIGHT TOTAL HIP ARTHROPLASTY ANTERIOR APPROACH;  Surgeon: Frederik Pear, MD;  Location: [facility];  Service: Orthopedics;  Laterality: Right;    Family History  Problem Relation Age of Onset   Diabetes Mother    Hyperlipidemia Father    Lung cancer Maternal Grandmother    Cancer Paternal Grandmother 28       ovarian that was metastatic to colon   Colon cancer Paternal Grandmother    Thyroid disease Paternal Grandmother    Heart failure Paternal Grandfather    Lung cancer Maternal Aunt     Social History   Tobacco Use   Smoking status: Never   Smokeless tobacco: Never  Vaping Use   Vaping Use: Never used  Substance Use Topics   Alcohol use: Yes    Comment: occ wine   Drug use: Never    Medications: I have reviewed the patient's current medications. Allergies as of 01/09/2022       Reactions   Ceclor [cefaclor] Rash   Fioricet-codeine [butalbital-apap-caff-cod] Rash   Stadol [butorphanol] Rash        Medication List  Accurate as of January 09, 2022 11:18 AM. If you have any questions, ask your nurse or doctor.          MOUNJARO [place] Inject into the skin.         ROS:  Constitutional: negative for chills, fatigue, and fevers Eyes: negative for visual disturbance and pain Ears, nose, mouth, throat, and face: negative for ear drainage, sore throat, and sinus problems Respiratory: negative for cough, wheezing, and shortness of breath Cardiovascular: negative for chest pain and palpitations Gastrointestinal: positive for reflux  symptoms, negative for abdominal pain, nausea, and vomiting Genitourinary:negative for dysuria, frequency, and urinary retention Integument/breast: negative for dryness and rash Hematologic/lymphatic: negative for bleeding and lymphadenopathy Musculoskeletal:negative for back pain, neck pain, and joint pain Neurological: negative for dizziness, tremors, and numbness Endocrine: negative for temperature intolerance  Blood pressure 111/77, pulse 85, temperature 98.5 F (36.9 C), temperature source Oral, resp. rate 16, height 5' 4.5" (1.638 m), weight 166 lb (75.3 kg), last menstrual period 07/01/2009, SpO2 97 %. Physical Exam Vitals reviewed.  Constitutional:      Appearance: Normal appearance.  HENT:     Head: Normocephalic and atraumatic.  Eyes:     Extraocular Movements: Extraocular movements intact.     Pupils: Pupils are equal, round, and reactive to light.  Cardiovascular:     Rate and Rhythm: Normal rate and regular rhythm.  Pulmonary:     Effort: Pulmonary effort is normal.     Breath sounds: Normal breath sounds.  Abdominal:     General: There is no distension.     Palpations: Abdomen is soft.     Tenderness: There is no abdominal tenderness.  Musculoskeletal:        General: Normal range of motion.     Cervical back: Normal range of motion.  Skin:    General: Skin is warm and dry.     Comments: Cyst of midline, upper back, 5 cm in size, overlying scar from previous I&D site, no erythema or induration  Neurological:     General: No focal deficit present.     Mental Status: She is alert and oriented to person, place, and time.  Psychiatric:        Mood and Affect: Mood normal.        Behavior: Behavior normal.     Results: No results found for this or any previous visit (from the past 48 hour(s)).  No results found.   Assessment & Plan:  Amy Lee is a 46 y.o. female who presents for back cyst excision.  -I explained the pathophysiology of epidermoid cyst.   Given that she has required previous I&D and it is causing her pain, recommend excision at this time -The risk and benefits of excision of back epidermoid cyst were discussed including but not limited to bleeding, infection, seroma formation, injury to surrounding structures, need for additional procedure, and wound breakdown.  After careful consideration, Amy Lee has decided to proceed with epidermoid cyst excision. -Patient tentatively scheduled for surgery on 7/17 -Information provided to the patient regarding epidermoid cyst removal  All questions were answered to the satisfaction of the patient.   Graciella Freer, DO [facility] [address], [place] [address] [phone] (office)

## 2022-01-09 NOTE — Patient Instructions (Signed)
[place] IVERY MICHALSKI  01/09/2022     '@[email or web address]'$ @   Your procedure is scheduled on  01/14/2022.   Report to [facility] at  0600  A.M.   Call this number if you have problems the morning of surgery:  [phone]   Remember:  Do not eat or drink after midnight.     Your last dose of mounjara should have been 7/10 or before.      Take these medicines the morning of surgery with A SIP OF WATER                                          None.    Do not wear jewelry, make-up or nail polish.  Do not wear lotions, powders, or perfumes, or deodorant.  Do not shave 48 hours prior to surgery.  Men may shave face and neck.  Do not bring valuables to the hospital.  [facility] is not responsible for any belongings or valuables.  Contacts, dentures or bridgework may not be worn into surgery.  Leave your suitcase in the car.  After surgery it may be brought to your room.  For patients admitted to the hospital, discharge time will be determined by your treatment team.  Patients discharged the day of surgery will not be allowed to drive home and must have someone with them for 24 hours.    Special instructions:   DO NOT smoke tobacco or vape for 24 hours before your procedure.  Please read over the following fact sheets that you were given. Coughing and Deep Breathing, Surgical Site Infection Prevention, Anesthesia Post-op Instructions, and Care and Recovery After Surgery      Excision of Skin Lesions, Care After The following information offers guidance on how to care for yourself after your procedure. Your health care provider may also give you more specific instructions. If you have problems or questions, contact your health care provider. What can I expect after the procedure? After your procedure, it is common to have: Soreness or mild pain. Some redness and swelling. Follow these instructions at home: Excision site care  Follow instructions from your health care  provider about how to take care of your excision site. Make sure you: Wash your hands with soap and water for at least 20 seconds before and after you change your bandage (dressing). If soap and water are not available, use hand sanitizer. Change your dressing as told by your health care provider. Leave stitches (sutures), skin glue, or adhesive strips in place. These skin closures may need to stay in place for 2 weeks or longer. If adhesive strip edges start to loosen and curl up, you may trim the loose edges. Do not remove adhesive strips completely unless your health care provider tells you to do that. Check the excision area every day for signs of infection. Watch for: More redness, swelling, or pain. Fluid or blood. Warmth. Pus or a bad smell. Keep the site clean, dry, and protected for at least 48 hours. For bleeding, apply gentle but firm pressure to the area using a folded towel for 20 minutes. Do not take baths, swim, or use a hot tub until your health care provider approves. Ask your health care provider if you may take showers. You may only be allowed to take sponge baths. General instructions Take over-the-counter and prescription  medicines only as told by your health care provider. Follow instructions from your health care provider about how to minimize scarring. Scarring should lessen over time. Avoid sun exposure until the area has healed. Use sunscreen to protect the area from the sun after it has healed. Avoid high-impact exercise and activities until the sutures are removed or the area heals. Keep all follow-up visits. This is important. Contact a health care provider if: You have more redness, swelling, or pain around your excision site. You have fluid or blood coming from your excision site. Your excision site feels warm to the touch. You have pus or a bad smell coming from your excision site. You have a fever. You have pain that does not improve in 2-3 days after your  procedure. Get help right away if: You have bleeding that does not stop with pressure or a dressing. Your wound opens up. Summary Take over-the-counter and prescription medicines only as told by your health care provider. Change your dressing as told by your health care provider. Contact a health care provider if you have redness, swelling, pain, or other signs of infection around your excision site. Keep all follow-up visits. This is important. This information is not intended to replace advice given to you by your health care provider. Make sure you discuss any questions you have with your health care provider. Document Revised: 01/16/2021 Document Reviewed: 01/16/2021 Elsevier Patient Education  [place] Anesthesia, Adult, Care After This sheet gives you information about how to care for yourself after your procedure. Your health care provider may also give you more specific instructions. If you have problems or questions, contact your health care provider. What can I expect after the procedure? After the procedure, the following side effects are common: Pain or discomfort at the IV site. Nausea. Vomiting. Sore throat. Trouble concentrating. Feeling cold or chills. Feeling weak or tired. Sleepiness and fatigue. Soreness and body aches. These side effects can affect parts of the body that were not involved in surgery. Follow these instructions at home: For the time period you were told by your health care provider:  Rest. Do not participate in activities where you could fall or become injured. Do not drive or use machinery. Do not drink alcohol. Do not take sleeping pills or medicines that cause drowsiness. Do not make important decisions or sign legal documents. Do not take care of children on your own. Eating and drinking Follow any instructions from your health care provider about eating or drinking restrictions. When you feel hungry, start by eating small  amounts of foods that are soft and easy to digest (bland), such as toast. Gradually return to your regular diet. Drink enough fluid to keep your urine pale yellow. If you vomit, rehydrate by drinking water, juice, or clear broth. General instructions If you have sleep apnea, surgery and certain medicines can increase your risk for breathing problems. Follow instructions from your health care provider about wearing your sleep device: Anytime you are sleeping, including during daytime naps. While taking prescription pain medicines, sleeping medicines, or medicines that make you drowsy. Have a responsible adult stay with you for the time you are told. It is important to have someone help care for you until you are awake and alert. Return to your normal activities as told by your health care provider. Ask your health care provider what activities are safe for you. Take over-the-counter and prescription medicines only as told by your health care provider. If you smoke,  do not smoke without supervision. Keep all follow-up visits as told by your health care provider. This is important. Contact a health care provider if: You have nausea or vomiting that does not get better with medicine. You cannot eat or drink without vomiting. You have pain that does not get better with medicine. You are unable to pass urine. You develop a skin rash. You have a fever. You have redness around your IV site that gets worse. Get help right away if: You have difficulty breathing. You have chest pain. You have blood in your urine or stool, or you vomit blood. Summary After the procedure, it is common to have a sore throat or nausea. It is also common to feel tired. Have a responsible adult stay with you for the time you are told. It is important to have someone help care for you until you are awake and alert. When you feel hungry, start by eating small amounts of foods that are soft and easy to digest (bland), such as  toast. Gradually return to your regular diet. Drink enough fluid to keep your urine pale yellow. Return to your normal activities as told by your health care provider. Ask your health care provider what activities are safe for you. This information is not intended to replace advice given to you by your health care provider. Make sure you discuss any questions you have with your health care provider. Document Revised: 03/02/2020 Document Reviewed: 09/30/2019 Elsevier Patient Education  [place]. How to Use Chlorhexidine for Bathing Chlorhexidine gluconate (CHG) is a germ-killing (antiseptic) solution that is used to clean the skin. It can get rid of the bacteria that normally live on the skin and can keep them away for about 24 hours. To clean your skin with CHG, you may be given: A CHG solution to use in the shower or as part of a sponge bath. A prepackaged cloth that contains CHG. Cleaning your skin with CHG may help lower the risk for infection: While you are staying in the intensive care unit of the hospital. If you have a vascular access, such as a central line, to provide short-term or long-term access to your veins. If you have a catheter to drain urine from your bladder. If you are on a ventilator. A ventilator is a machine that helps you breathe by moving air in and out of your lungs. After surgery. What are the risks? Risks of using CHG include: A skin reaction. Hearing loss, if CHG gets in your ears and you have a perforated eardrum. Eye injury, if CHG gets in your eyes and is not rinsed out. The CHG product catching fire. Make sure that you avoid smoking and flames after applying CHG to your skin. Do not use CHG: If you have a chlorhexidine allergy or have previously reacted to chlorhexidine. On babies younger than 80 months of age. How to use CHG solution Use CHG only as told by your health care provider, and follow the instructions on the label. Use the full amount  of CHG as directed. Usually, this is one bottle. During a shower Follow these steps when using CHG solution during a shower (unless your health care provider gives you different instructions): Start the shower. Use your normal soap and shampoo to wash your face and hair. Turn off the shower or move out of the shower stream. Pour the CHG onto a clean washcloth. Do not use any type of brush or rough-edged sponge. Starting at your neck, lather  your body down to your toes. Make sure you follow these instructions: If you will be having surgery, pay special attention to the part of your body where you will be having surgery. Scrub this area for at least 1 minute. Do not use CHG on your head or face. If the solution gets into your ears or eyes, rinse them well with water. Avoid your genital area. Avoid any areas of skin that have broken skin, cuts, or scrapes. Scrub your back and under your arms. Make sure to wash skin folds. Let the lather sit on your skin for 1-2 minutes or as long as told by your health care provider. Thoroughly rinse your entire body in the shower. Make sure that all body creases and crevices are rinsed well. Dry off with a clean towel. Do not put any substances on your body afterward--such as powder, lotion, or perfume--unless you are told to do so by your health care provider. Only use lotions that are recommended by the manufacturer. Put on clean clothes or pajamas. If it is the night before your surgery, sleep in clean sheets.  During a sponge bath Follow these steps when using CHG solution during a sponge bath (unless your health care provider gives you different instructions): Use your normal soap and shampoo to wash your face and hair. Pour the CHG onto a clean washcloth. Starting at your neck, lather your body down to your toes. Make sure you follow these instructions: If you will be having surgery, pay special attention to the part of your body where you will be having  surgery. Scrub this area for at least 1 minute. Do not use CHG on your head or face. If the solution gets into your ears or eyes, rinse them well with water. Avoid your genital area. Avoid any areas of skin that have broken skin, cuts, or scrapes. Scrub your back and under your arms. Make sure to wash skin folds. Let the lather sit on your skin for 1-2 minutes or as long as told by your health care provider. Using a different clean, wet washcloth, thoroughly rinse your entire body. Make sure that all body creases and crevices are rinsed well. Dry off with a clean towel. Do not put any substances on your body afterward--such as powder, lotion, or perfume--unless you are told to do so by your health care provider. Only use lotions that are recommended by the manufacturer. Put on clean clothes or pajamas. If it is the night before your surgery, sleep in clean sheets. How to use CHG prepackaged cloths Only use CHG cloths as told by your health care provider, and follow the instructions on the label. Use the CHG cloth on clean, dry skin. Do not use the CHG cloth on your head or face unless your health care provider tells you to. When washing with the CHG cloth: Avoid your genital area. Avoid any areas of skin that have broken skin, cuts, or scrapes. Before surgery Follow these steps when using a CHG cloth to clean before surgery (unless your health care provider gives you different instructions): Using the CHG cloth, vigorously scrub the part of your body where you will be having surgery. Scrub using a back-and-forth motion for 3 minutes. The area on your body should be completely wet with CHG when you are done scrubbing. Do not rinse. Discard the cloth and let the area air-dry. Do not put any substances on the area afterward, such as powder, lotion, or perfume. Put on clean clothes  or pajamas. If it is the night before your surgery, sleep in clean sheets.  For general bathing Follow these steps  when using CHG cloths for general bathing (unless your health care provider gives you different instructions). Use a separate CHG cloth for each area of your body. Make sure you wash between any folds of skin and between your fingers and toes. Wash your body in the following order, switching to a new cloth after each step: The front of your neck, shoulders, and chest. Both of your arms, under your arms, and your hands. Your stomach and groin area, avoiding the genitals. Your right leg and foot. Your left leg and foot. The back of your neck, your back, and your buttocks. Do not rinse. Discard the cloth and let the area air-dry. Do not put any substances on your body afterward--such as powder, lotion, or perfume--unless you are told to do so by your health care provider. Only use lotions that are recommended by the manufacturer. Put on clean clothes or pajamas. Contact a health care provider if: Your skin gets irritated after scrubbing. You have questions about using your solution or cloth. You swallow any chlorhexidine. Call your local poison control center (1-610-297-1567 in the [place] Get help right away if: Your eyes itch badly, or they become very red or swollen. Your skin itches badly and is red or swollen. Your hearing changes. You have trouble seeing. You have swelling or tingling in your mouth or throat. You have trouble breathing. These symptoms may represent a serious problem that is an emergency. Do not wait to see if the symptoms will go away. Get medical help right away. Call your local emergency services (911 in the [place] Do not drive yourself to the hospital. Summary Chlorhexidine gluconate (CHG) is a germ-killing (antiseptic) solution that is used to clean the skin. Cleaning your skin with CHG may help to lower your risk for infection. You may be given CHG to use for bathing. It may be in a bottle or in a prepackaged cloth to use on your skin. Carefully follow your health care  provider's instructions and the instructions on the product label. Do not use CHG if you have a chlorhexidine allergy. Contact your health care provider if your skin gets irritated after scrubbing. This information is not intended to replace advice given to you by your health care provider. Make sure you discuss any questions you have with your health care provider. Document Revised: 08/28/2020 Document Reviewed: 08/28/2020 Elsevier Patient Education  [place].

## 2022-01-10 ENCOUNTER — Encounter (HOSPITAL_COMMUNITY)
Admission: RE | Admit: 2022-01-10 | Discharge: 2022-01-10 | Disposition: A | Discharge status: Home / Self Care | Payer: 59 | Source: Ambulatory Visit | Attending: Surgery | Admitting: Surgery

## 2022-01-14 ENCOUNTER — Ambulatory Visit (HOSPITAL_COMMUNITY): Payer: 59 | Admitting: Anesthesiology

## 2022-01-14 ENCOUNTER — Encounter (HOSPITAL_COMMUNITY): Payer: Self-pay | Admitting: Surgery

## 2022-01-14 ENCOUNTER — Ambulatory Visit (HOSPITAL_BASED_OUTPATIENT_CLINIC_OR_DEPARTMENT_OTHER): Payer: 59 | Admitting: Anesthesiology

## 2022-01-14 ENCOUNTER — Ambulatory Visit (HOSPITAL_COMMUNITY)
Admission: RE | Admit: 2022-01-14 | Discharge: 2022-01-14 | Disposition: A | Discharge status: Home / Self Care | Payer: 59 | Source: Ambulatory Visit | Attending: Surgery | Admitting: Surgery

## 2022-01-14 ENCOUNTER — Encounter (HOSPITAL_COMMUNITY)
Admission: RE | Disposition: A | Discharge status: Home / Self Care | Payer: Self-pay | Source: Ambulatory Visit | Attending: Surgery

## 2022-01-14 ENCOUNTER — Other Ambulatory Visit: Payer: Self-pay

## 2022-01-14 DIAGNOSIS — F4322 Adjustment disorder with anxiety: Secondary | ICD-10-CM

## 2022-01-14 DIAGNOSIS — R222 Localized swelling, mass and lump, trunk: Secondary | ICD-10-CM | POA: Insufficient documentation

## 2022-01-14 DIAGNOSIS — L723 Sebaceous cyst: Secondary | ICD-10-CM | POA: Diagnosis not present

## 2022-01-14 DIAGNOSIS — L720 Epidermal cyst: Secondary | ICD-10-CM | POA: Diagnosis not present

## 2022-01-14 DIAGNOSIS — Z90710 Acquired absence of both cervix and uterus: Secondary | ICD-10-CM | POA: Diagnosis not present

## 2022-01-14 DIAGNOSIS — Z9049 Acquired absence of other specified parts of digestive tract: Secondary | ICD-10-CM | POA: Insufficient documentation

## 2022-01-14 DIAGNOSIS — T8141XA Infection following a procedure, superficial incisional surgical site, initial encounter: Secondary | ICD-10-CM | POA: Diagnosis not present

## 2022-01-14 DIAGNOSIS — Z96641 Presence of right artificial hip joint: Secondary | ICD-10-CM | POA: Diagnosis not present

## 2022-01-14 DIAGNOSIS — M1611 Unilateral primary osteoarthritis, right hip: Secondary | ICD-10-CM

## 2022-01-14 DIAGNOSIS — L02212 Cutaneous abscess of back [any part, except buttock]: Secondary | ICD-10-CM | POA: Diagnosis not present

## 2022-01-14 HISTORY — PX: MASS EXCISION: SHX2000

## 2022-01-14 SURGERY — EXCISION MASS
Anesthesia: General | Site: Back

## 2022-01-14 MED ORDER — DEXAMETHASONE SODIUM PHOSPHATE 10 MG/ML IJ SOLN
INTRAMUSCULAR | Status: AC
  Filled 2022-01-14: qty 1

## 2022-01-14 MED ORDER — VANCOMYCIN HCL IN DEXTROSE 1-5 GM/200ML-% IV SOLN
1000.0000 mg | Freq: Once | INTRAVENOUS | Status: AC
  Administered 2022-01-14: 1000 mg via INTRAVENOUS
  Filled 2022-01-14: qty 200

## 2022-01-14 MED ORDER — CHLORHEXIDINE GLUCONATE CLOTH 2 % EX PADS: 6.0000 | MEDICATED_PAD | Freq: Once | CUTANEOUS | Status: DC

## 2022-01-14 MED ORDER — CHLORHEXIDINE GLUCONATE 0.12 % MT SOLN
15.0000 mL | Freq: Once | OROMUCOSAL | Status: AC
  Administered 2022-01-14: 15 mL via OROMUCOSAL

## 2022-01-14 MED ORDER — HYDROMORPHONE HCL 1 MG/ML IJ SOLN
INTRAMUSCULAR | Status: AC
  Filled 2022-01-14: qty 0.5

## 2022-01-14 MED ORDER — ROCURONIUM BROMIDE 10 MG/ML (PF) SYRINGE
PREFILLED_SYRINGE | INTRAVENOUS | Status: AC
  Filled 2022-01-14: qty 10

## 2022-01-14 MED ORDER — GLYCOPYRROLATE PF 0.2 MG/ML IJ SOSY
PREFILLED_SYRINGE | INTRAMUSCULAR | Status: DC | PRN
  Administered 2022-01-14 (×2): .1 mg via INTRAVENOUS

## 2022-01-14 MED ORDER — FENTANYL CITRATE (PF) 250 MCG/5ML IJ SOLN
INTRAMUSCULAR | Status: AC
  Filled 2022-01-14: qty 5

## 2022-01-14 MED ORDER — PROPOFOL 10 MG/ML IV BOLUS
INTRAVENOUS | Status: DC | PRN
  Administered 2022-01-14: 160 mg via INTRAVENOUS

## 2022-01-14 MED ORDER — LIDOCAINE 2% (20 MG/ML) 5 ML SYRINGE
INTRAMUSCULAR | Status: DC | PRN
  Administered 2022-01-14: 100 mg via INTRAVENOUS

## 2022-01-14 MED ORDER — ORAL CARE MOUTH RINSE: 15.0000 mL | Freq: Once | OROMUCOSAL | Status: AC

## 2022-01-14 MED ORDER — DOCUSATE SODIUM 100 MG PO CAPS: 100.0000 mg | ORAL_CAPSULE | Freq: Two times a day (BID) | ORAL | 2 refills | Status: AC

## 2022-01-14 MED ORDER — SULFAMETHOXAZOLE-TRIMETHOPRIM 800-160 MG PO TABS: 1.0000 | ORAL_TABLET | Freq: Two times a day (BID) | ORAL | 0 refills | Status: AC

## 2022-01-14 MED ORDER — MIDAZOLAM HCL 2 MG/2ML IJ SOLN
INTRAMUSCULAR | Status: DC | PRN
  Administered 2022-01-14: 2 mg via INTRAVENOUS

## 2022-01-14 MED ORDER — BUPIVACAINE HCL (PF) 0.5 % IJ SOLN
INTRAMUSCULAR | Status: AC
  Filled 2022-01-14: qty 30

## 2022-01-14 MED ORDER — ONDANSETRON HCL 4 MG/2ML IJ SOLN
INTRAMUSCULAR | Status: AC
  Filled 2022-01-14: qty 2

## 2022-01-14 MED ORDER — MEPERIDINE HCL 50 MG/ML IJ SOLN: 6.2500 mg | INTRAMUSCULAR | Status: DC | PRN

## 2022-01-14 MED ORDER — BUPIVACAINE HCL (PF) 0.5 % IJ SOLN
INTRAMUSCULAR | Status: DC | PRN
  Administered 2022-01-14: 30 mL

## 2022-01-14 MED ORDER — GLYCOPYRROLATE PF 0.2 MG/ML IJ SOSY
PREFILLED_SYRINGE | INTRAMUSCULAR | Status: AC
  Filled 2022-01-14: qty 1

## 2022-01-14 MED ORDER — KETOROLAC TROMETHAMINE 30 MG/ML IJ SOLN
INTRAMUSCULAR | Status: AC
  Filled 2022-01-14: qty 1

## 2022-01-14 MED ORDER — SUGAMMADEX SODIUM 200 MG/2ML IV SOLN
INTRAVENOUS | Status: DC | PRN
  Administered 2022-01-14: 200 mg via INTRAVENOUS

## 2022-01-14 MED ORDER — ROCURONIUM BROMIDE 10 MG/ML (PF) SYRINGE
PREFILLED_SYRINGE | INTRAVENOUS | Status: DC | PRN
  Administered 2022-01-14: 50 mg via INTRAVENOUS

## 2022-01-14 MED ORDER — HYDROMORPHONE HCL 1 MG/ML IJ SOLN
0.2500 mg | INTRAMUSCULAR | Status: AC | PRN
  Administered 2022-01-14 (×4): 0.5 mg via INTRAVENOUS
  Filled 2022-01-14 (×2): qty 0.5

## 2022-01-14 MED ORDER — MIDAZOLAM HCL 2 MG/2ML IJ SOLN
INTRAMUSCULAR | Status: AC
  Filled 2022-01-14: qty 2

## 2022-01-14 MED ORDER — ACETAMINOPHEN 500 MG PO TABS: 1000.0000 mg | ORAL_TABLET | Freq: Four times a day (QID) | ORAL | 0 refills | Status: AC

## 2022-01-14 MED ORDER — FLUCONAZOLE 150 MG PO TABS: 150.0000 mg | ORAL_TABLET | Freq: Every day | ORAL | 0 refills | Status: AC

## 2022-01-14 MED ORDER — LIDOCAINE HCL (PF) 2 % IJ SOLN
INTRAMUSCULAR | Status: AC
  Filled 2022-01-14: qty 5

## 2022-01-14 MED ORDER — FENTANYL CITRATE (PF) 250 MCG/5ML IJ SOLN
INTRAMUSCULAR | Status: DC | PRN
Start: 2022-01-14 — End: 2022-01-14
  Administered 2022-01-14 (×3): 50 ug via INTRAVENOUS

## 2022-01-14 MED ORDER — LACTATED RINGERS IV SOLN
INTRAVENOUS | Status: DC
  Administered 2022-01-14: 1000 mL via INTRAVENOUS

## 2022-01-14 MED ORDER — 0.9 % SODIUM CHLORIDE (POUR BTL) OPTIME
TOPICAL | Status: DC | PRN
  Administered 2022-01-14: 1000 mL

## 2022-01-14 MED ORDER — DEXAMETHASONE SODIUM PHOSPHATE 4 MG/ML IJ SOLN
INTRAMUSCULAR | Status: DC | PRN
  Administered 2022-01-14: 5 mg via INTRAVENOUS

## 2022-01-14 MED ORDER — PROPOFOL 10 MG/ML IV BOLUS
INTRAVENOUS | Status: AC
  Filled 2022-01-14: qty 20

## 2022-01-14 MED ORDER — ONDANSETRON HCL 4 MG/2ML IJ SOLN
INTRAMUSCULAR | Status: DC | PRN
  Administered 2022-01-14: 4 mg via INTRAVENOUS

## 2022-01-14 MED ORDER — OXYCODONE HCL 5 MG PO TABS: 5.0000 mg | ORAL_TABLET | Freq: Four times a day (QID) | ORAL | 0 refills | Status: AC | PRN

## 2022-01-14 MED ORDER — ONDANSETRON HCL 4 MG/2ML IJ SOLN: 4.0000 mg | Freq: Once | INTRAMUSCULAR | Status: DC | PRN

## 2022-01-14 SURGICAL SUPPLY — 34 items
ADH SKN CLS APL DERMABOND .7 (GAUZE/BANDAGES/DRESSINGS) ×1
APL PRP STRL LF ISPRP CHG 10.5 (MISCELLANEOUS) ×1
APPLICATOR CHLORAPREP 10.5 ORG (MISCELLANEOUS) ×2 IMPLANT
CLOTH BEACON ORANGE TIMEOUT ST (SAFETY) ×2 IMPLANT
COVER LIGHT HANDLE STERIS (MISCELLANEOUS) ×4 IMPLANT
DECANTER SPIKE VIAL GLASS SM (MISCELLANEOUS) ×2 IMPLANT
DERMABOND ADVANCED (GAUZE/BANDAGES/DRESSINGS) ×1
DERMABOND ADVANCED .7 DNX12 (GAUZE/BANDAGES/DRESSINGS) IMPLANT
DRSG TEGADERM 4X4.75 (GAUZE/BANDAGES/DRESSINGS) ×1 IMPLANT
ELECT REM PT RETURN 9FT ADLT (ELECTROSURGICAL) ×2
ELECTRODE REM PT RTRN 9FT ADLT (ELECTROSURGICAL) ×1 IMPLANT
GAUZE 4X4 16PLY ~~LOC~~+RFID DBL (SPONGE) ×1 IMPLANT
GAUZE SPONGE 4X4 12PLY STRL (GAUZE/BANDAGES/DRESSINGS) ×1 IMPLANT
GLOVE BIO SURGEON STRL SZ 6.5 (GLOVE) ×1 IMPLANT
GLOVE BIOGEL PI IND STRL 6.5 (GLOVE) ×1 IMPLANT
GLOVE BIOGEL PI IND STRL 7.0 (GLOVE) ×2 IMPLANT
GLOVE BIOGEL PI INDICATOR 6.5 (GLOVE) ×2
GLOVE BIOGEL PI INDICATOR 7.0 (GLOVE) ×2
GLOVE SURG SS PI 6.5 STRL IVOR (GLOVE) ×4 IMPLANT
GLOVE SURG SS PI 7.0 STRL IVOR (GLOVE) ×1 IMPLANT
GOWN STRL REUS W/TWL LRG LVL3 (GOWN DISPOSABLE) ×4 IMPLANT
KIT TURNOVER KIT A (KITS) ×2 IMPLANT
MANIFOLD NEPTUNE II (INSTRUMENTS) ×2 IMPLANT
NDL HYPO 25X1 1.5 SAFETY (NEEDLE) ×1 IMPLANT
NEEDLE HYPO 25X1 1.5 SAFETY (NEEDLE) ×2 IMPLANT
NS IRRIG 1000ML POUR BTL (IV SOLUTION) ×2 IMPLANT
PACK MINOR (CUSTOM PROCEDURE TRAY) ×1 IMPLANT
PAD ARMBOARD 7.5X6 YLW CONV (MISCELLANEOUS) ×2 IMPLANT
SET BASIN LINEN APH (SET/KITS/TRAYS/PACK) ×2 IMPLANT
SUT MNCRL AB 4-0 PS2 18 (SUTURE) ×2 IMPLANT
SUT VIC AB 3-0 SH 27 (SUTURE) ×4
SUT VIC AB 3-0 SH 27X BRD (SUTURE) ×1 IMPLANT
SYR BULB IRRIG 60ML STRL (SYRINGE) ×1 IMPLANT
SYR CONTROL 10ML LL (SYRINGE) ×2 IMPLANT

## 2022-01-14 NOTE — Interval H&P Note (Signed)
History and Physical Interval Note:  01/14/2022 7:24 AM  Amy Lee  has presented today for surgery, with the diagnosis of Mass, cyst, 5 cm, back.  The various methods of treatment have been discussed with the patient and family. After consideration of risks, benefits and other options for treatment, the patient has consented to  Procedure(s): EXCISION MASS; 5cm, back (N/A) as a surgical intervention.  The patient's history has been reviewed, patient examined, no change in status, stable for surgery.  I have reviewed the patient's chart and labs.  Questions were answered to the patient's satisfaction.     [place]

## 2022-01-14 NOTE — Transfer of Care (Signed)
Immediate Anesthesia Transfer of Care Note  Patient: Amy Lee  Procedure(s) Performed: EXCISION MASS; 5cm, back (Back)  Patient Location: PACU  Anesthesia Type:General  Level of Consciousness: drowsy  Airway & Oxygen Therapy: Patient Spontanous Breathing and Patient connected to nasal cannula oxygen  Post-op Assessment: Report given to RN and Post -op Vital signs reviewed and stable  Post vital signs: Reviewed and stable  Last Vitals:  Vitals Value Taken Time  BP    Temp    Pulse    Resp    SpO2      Last Pain:  Vitals:   01/14/22 0631  TempSrc: Oral  PainSc: 3       Patients Stated Pain Goal: 7 (94/17/40 [id number])  Complications: No notable events documented.

## 2022-01-14 NOTE — Discharge Instructions (Signed)
Ambulatory Surgery Discharge Instructions  General Anesthesia or Sedation Do not drive or operate heavy machinery for 24 hours.  Do not consume alcohol, tranquilizers, sleeping medications, or any non-prescribed medications for 24 hours. Do not make important decisions or sign any important papers in the next 24 hours. You should have someone with you tonight at home.  Activity  You are advised to go directly home from the hospital.  Restrict your activities and rest for a day.  Resume light activity tomorrow. No heavy lifting over 10 lbs or strenuous exercise.  Fluids and Diet Begin with clear liquids, bouillon, dry toast, soda crackers.  If not nauseated, you may go to a regular diet when you desire.  Greasy and spicy foods are not advised.  Medications  If you have not had a bowel movement in 24 hours, take 2 tablespoons over the counter Milk of mag.             You May resume your blood thinners tomorrow (Aspirin, coumadin, or other).  You are being discharged with prescriptions for Opioid/Narcotic Medications: There are some specific considerations for these medications that you should know. Opioid Meds have risks & benefits. Addiction to these meds is always a concern with prolonged use Take medication only as directed Do not drive while taking narcotic pain medication Do not crush tablets or capsules Do not use a different container than medication was dispensed in Lock the container of medication in a cool, dry place out of reach of children and pets. Opioid medication can cause addiction Do not share with anyone else (this is a felony) Do not store medications for future use. Dispose of them properly.     Disposal:  Find a [employer] household drug take back site near you.  If you can't get to a drug take back site, use the recipe below as a last resort to dispose of expired, unused or unwanted drugs. Disposal  (Do not dispose chemotherapy drugs this way, talk to your  prescribing doctor instead.) Step 1: Mix drugs (do not crush) with dirt, kitty litter, or used coffee grounds and add a small amount of water to dissolve any solid medications. Step 2: Seal drugs in plastic bag. Step 3: Place plastic bag in trash. Step 4: Take prescription container and scratch out personal information, then recycle or throw away.  Operative Site  You have an external dressing.  You may remove this tomorrow. You have a liquid bandage over your incisions, this will begin to flake off in about a week. Ok to [employer] developer. Keep wound clean and dry. No baths or swimming. No lifting more than 10 pounds.  Contact Information: If you have questions or concerns, please call our office, [phone], Monday- Thursday 8AM-5PM and Friday 8AM-12Noon.  If it is after hours or on the weekend, please call [facility] Main Number, [phone], and ask to speak to the surgeon on call for Dr. Okey Dupre at [facility]   SPECIFIC COMPLICATIONS TO WATCH FOR: Inability to urinate Fever over 101? F by mouth Nausea and vomiting lasting longer than 24 hours. Pain not relieved by medication ordered Swelling around the operative site Increased redness, warmth, hardness, around operative area Numbness, tingling, or cold fingers or toes Blood -soaked dressing, (small amounts of oozing may be normal) Increasing and progressive drainage from surgical area or exam site

## 2022-01-14 NOTE — Anesthesia Procedure Notes (Addendum)
Procedure Name: Intubation Date/Time: 01/14/2022 7:35 AM  Performed by: Orlie Dakin, CRNAPre-anesthesia Checklist: Patient identified, Emergency Drugs available, Suction available and Patient being monitored Patient Re-evaluated:Patient Re-evaluated prior to induction Oxygen Delivery Method: Circle system utilized Preoxygenation: Pre-oxygenation with 100% oxygen Induction Type: IV induction Ventilation: Mask ventilation without difficulty Laryngoscope Size: Miller and 3 Grade View: Grade I Tube type: Oral Tube size: 7.0 mm Number of attempts: 1 Airway Equipment and Method: Stylet Placement Confirmation: ETT inserted through vocal cords under direct vision, positive ETCO2 and breath sounds checked- equal and bilateral Secured at: 22 cm Tube secured with: Tape Dental Injury: Teeth and Oropharynx as per pre-operative assessment  Comments: 4x4s bite block used.

## 2022-01-14 NOTE — Op Note (Signed)
SURGICAL OPERATIVE REPORT  DATE OF PROCEDURE: 01/14/2022  ATTENDING Surgeon(s): Donis Pinder, Flint Melter, DO  ANESTHESIA: General  PRE-OPERATIVE DIAGNOSIS: Increasingly symptomatic (painful, intermittently draining) recurrent sebaceous cyst of the back with prior infection s/p incisions and drainage procedures   POST-OPERATIVE DIAGNOSIS: Increasingly symptomatic (painful, intermittently draining) recurrent sebaceous cyst of the back with prior infection s/p incisions and drainage procedures   PROCEDURE(S):  1.) Excision of symptomatic (painful, intermittently draining) recurrent sebaceous cyst of the back    INTRAOPERATIVE FINDINGS: 5 cm x 3 cm infected subcutaneous mass of the back (most likely a sebaceous cyst), removed intact  ESTIMATED BLOOD LOSS: Minimal (< 20 mL)  URINE OUTPUT: No Foley  SPECIMENS: 5 cm x 3 cm infected subcutaneous mass of the back (most likely a sebaceous cyst), removed intact  IMPLANTS: None  DRAINS: None  COMPLICATIONS: None apparent  CONDITION AT END OF PROCEDURE: Hemodynamically stable and awake  DISPOSITION OF PATIENT: PACU  INDICATIONS FOR PROCEDURE:  Patient is a 46 y.o. female who presented for a recurrent symptomatic (painful, intermittently draining) recurrent sebaceous cyst of the back with prior infection s/p incisions and drainage procedures, at which time patient was told it was a sebaceous cyst. Patient reports it's been present and growing for years, and recently has become increasingly painful due in part to its location. Patient requested that it be removed so activities can be performed with less discomfort, and patient was accordingly referred for surgical evaluation and management. All risks, benefits, and alternatives to above procedure were discussed with the patient, all of patient's questions were answered to his expressed satisfaction, and informed consent was obtained and documented.   DETAILS OF PROCEDURE: Patient was brought  to the operating suite and was appropriately identified. Laying on the OR table with her Left side down, operative site was prepped and draped in the usual sterile fashion, and a time out was performed.   An elliptical incision was made overlying the mass. The incision was extended deep around subcutaneous mass using electrocautery. During the course of dissecting free the cyst, it was disrupted and was removed intact. An additional piece of what appeared to be the cyst wall was excised. Further examination did not reveal any additional cyst. Hemostasis was achieved, and the wound was copiously irrigated with sterile warm saline. The incision was localized. Deep tissue and dermis were re-approximated using buried interrupted 3-0 Vicryl suture, and 4-0 Monocryl suture was used to re-approximate epidermis. Skin was then cleaned and dried, and Dermabond skin glue was applied to the wound. Once the glue was dry, a 4x4 was placed over the incision, and Tegaderm dressing was applied. Patient was then safely transferred to recovery for post-procedural monitoring and care.  I was present for all aspects of the above procedure, and no operative complications were apparent.  Graciella Freer, DO [facility] [address] [place], [place] [address] [phone] (office)

## 2022-01-14 NOTE — Progress Notes (Signed)
Update Note:  I spoke with the patient that patient's fianc in the consultation room.  I explained that I was able to remove the mass without difficulty.  It did appear that there was some pus within the cyst, so I will send her home with some antibiotics.  I have discharged her with Roxicodone to be taken as needed for pain.  If she takes the narcotic pain medication, she should take a stool softener to prevent constipation.  I also recommend that she take scheduled Tylenol for the next 5 days.  She will do a phone follow-up with me in 2 weeks, unless she has concerns in which case we will change it to an in person visit.  She can shower tomorrow and remove the outer dressing at that time.  She should minimize her upper arm movements postoperatively.  All questions were answered to his expressed satisfaction.  Graciella Freer, DO [facility] [address] [place], [place] [address] [phone] (office)

## 2022-01-14 NOTE — Anesthesia Postprocedure Evaluation (Signed)
Anesthesia Post Note  Patient: Amy Lee  Procedure(s) Performed: EXCISION MASS; 5cm, back (Back)  Patient location during evaluation: Phase II Anesthesia Type: General Level of consciousness: awake and alert and oriented Pain management: pain level controlled Vital Signs Assessment: post-procedure vital signs reviewed and stable Respiratory status: spontaneous breathing, nonlabored ventilation and respiratory function stable Cardiovascular status: blood pressure returned to baseline and stable Postop Assessment: no apparent nausea or vomiting Anesthetic complications: no   No notable events documented.   Last Vitals:  Vitals:   01/14/22 1023 01/14/22 1031  BP: 115/87 98/65  Pulse: 64 70  Resp: 12 18  Temp:  36.5 C  SpO2: 100% 99%    Last Pain:  Vitals:   01/14/22 1031  TempSrc: Oral  PainSc: 3                  Fairy Ashlock C Laconda Basich

## 2022-01-14 NOTE — Anesthesia Preprocedure Evaluation (Signed)
Anesthesia Evaluation  Patient identified by MRN, date of birth, ID band Patient awake    Reviewed: Allergy & Precautions, NPO status , Patient's Chart, lab work & pertinent test results  Airway Mallampati: II  TM Distance: >3 FB Neck ROM: Full    Dental  (+) Dental Advisory Given, Chipped,    Pulmonary pneumonia, resolved,    Pulmonary exam normal breath sounds clear to auscultation       Cardiovascular negative cardio ROS Normal cardiovascular exam Rhythm:Regular Rate:Normal     Neuro/Psych  Headaches, PSYCHIATRIC DISORDERS    GI/Hepatic negative GI ROS, Neg liver ROS,   Endo/Other  negative endocrine ROS  Renal/GU negative Renal ROS  negative genitourinary   Musculoskeletal  (+) Arthritis , Osteoarthritis,    Abdominal   Peds negative pediatric ROS (+)  Hematology negative hematology ROS (+)   Anesthesia Other Findings   Reproductive/Obstetrics negative OB ROS                            Anesthesia Physical Anesthesia Plan  ASA: 2  Anesthesia Plan: General   Post-op Pain Management: Dilaudid IV   Induction: Intravenous  PONV Risk Score and Plan: 4 or greater and Ondansetron and Dexamethasone  Airway Management Planned: Oral ETT  Additional Equipment:   Intra-op Plan:   Post-operative Plan: Extubation in OR  Informed Consent: I have reviewed the patients History and Physical, chart, labs and discussed the procedure including the risks, benefits and alternatives for the proposed anesthesia with the patient or authorized representative who has indicated his/her understanding and acceptance.     Dental advisory given  Plan Discussed with: CRNA and Surgeon  Anesthesia Plan Comments:        Anesthesia Quick Evaluation

## 2022-01-15 LAB — SURGICAL PATHOLOGY

## 2022-01-16 ENCOUNTER — Encounter (HOSPITAL_COMMUNITY): Payer: Self-pay | Admitting: Surgery

## 2022-01-17 ENCOUNTER — Ambulatory Visit: Payer: 59 | Admitting: Surgery

## 2022-01-25 ENCOUNTER — Telehealth: Payer: Self-pay | Admitting: *Deleted

## 2022-01-25 NOTE — Telephone Encounter (Signed)
Received call from patient [phone]~ telephone.   Surgical Date: 01/14/2022 Procedure: Excision Lipoma, Back  Patient called office and requested to see Dr. Okey Dupre this AM. Advised that provider is out of the office as she is currently in OR.   [place] office staff inquired as to reason for request. Patient stated that she is having increased pain in right shoulder that is so severe she cannot put her purse on that side. Recommended patient speak with nurse. Patient states that she will talk to Dr. Okey Dupre next week. Offered to speak with triage for further recommendations, but phone call was disconnected.   Triage attempted to call to reconnect with patient in regards to concerns. Left voicemail on cell phone.   Dr. Okey Dupre to be made aware.

## 2022-01-29 ENCOUNTER — Encounter (HOSPITAL_BASED_OUTPATIENT_CLINIC_OR_DEPARTMENT_OTHER): Payer: Self-pay | Admitting: Obstetrics & Gynecology

## 2022-01-30 ENCOUNTER — Encounter: Payer: 59 | Admitting: Surgery

## 2022-02-07 ENCOUNTER — Encounter: Payer: 59 | Admitting: Surgery

## 2022-03-11 ENCOUNTER — Other Ambulatory Visit: Payer: Self-pay | Admitting: Obstetrics & Gynecology

## 2022-03-12 ENCOUNTER — Telehealth: Payer: 59 | Admitting: Physician Assistant

## 2022-03-12 DIAGNOSIS — B3731 Acute candidiasis of vulva and vagina: Secondary | ICD-10-CM

## 2022-03-12 MED ORDER — FLUCONAZOLE 150 MG PO TABS: ORAL_TABLET | ORAL | 0 refills | Status: DC

## 2022-03-12 NOTE — Progress Notes (Signed)
I have spent 5 minutes in review of e-visit questionnaire, review and updating patient chart, medical decision making and response to patient.   Anisha Starliper Cody Laurelin Elson, PA-C    

## 2022-03-12 NOTE — Progress Notes (Signed)

## 2022-08-07 ENCOUNTER — Encounter (HOSPITAL_BASED_OUTPATIENT_CLINIC_OR_DEPARTMENT_OTHER): Payer: Self-pay | Admitting: Obstetrics & Gynecology

## 2022-08-07 ENCOUNTER — Ambulatory Visit (HOSPITAL_BASED_OUTPATIENT_CLINIC_OR_DEPARTMENT_OTHER)
Admission: RE | Admit: 2022-08-07 | Discharge: 2022-08-07 | Disposition: A | Discharge status: Home / Self Care | Payer: Self-pay | Source: Ambulatory Visit | Attending: Obstetrics & Gynecology | Admitting: Obstetrics & Gynecology

## 2022-08-07 ENCOUNTER — Ambulatory Visit (INDEPENDENT_AMBULATORY_CARE_PROVIDER_SITE_OTHER): Payer: Self-pay | Admitting: Obstetrics & Gynecology

## 2022-08-07 ENCOUNTER — Other Ambulatory Visit (HOSPITAL_BASED_OUTPATIENT_CLINIC_OR_DEPARTMENT_OTHER): Payer: Self-pay | Admitting: Obstetrics & Gynecology

## 2022-08-07 VITALS — BP 129/83 | HR 77 | Ht 65.0 in | Wt 161.2 lb

## 2022-08-07 DIAGNOSIS — Z1211 Encounter for screening for malignant neoplasm of colon: Secondary | ICD-10-CM

## 2022-08-07 DIAGNOSIS — Z90710 Acquired absence of both cervix and uterus: Secondary | ICD-10-CM | POA: Insufficient documentation

## 2022-08-07 DIAGNOSIS — Z1231 Encounter for screening mammogram for malignant neoplasm of breast: Secondary | ICD-10-CM

## 2022-08-07 DIAGNOSIS — Z01419 Encounter for gynecological examination (general) (routine) without abnormal findings: Secondary | ICD-10-CM

## 2022-08-07 NOTE — Progress Notes (Signed)
47 y.o. [id number] Single White or Caucasian female here for annual exam.  Doing well.  Has MMG scheduled today.  Is feeling some breast tenderness on the right side.  Has noticed this when she is face down, having a massage.  Has noticed this twice.  She has Grade C.   Denies vaginal bleeding.     Patient's last menstrual period was 07/01/2009.          Sexually active: Yes.    The current method of family planning is status post hysterectomy.    Smoker:  no  Health Maintenance: Pap:  07/31/2021 Negative with neg HR HPV History of abnormal Pap:  h/o LGSIL MMG:  07/31/2021 Negative Colonoscopy:  cologuard ordered Screening Labs: plan fasting labs next year   reports that she has never smoked. She has never used smokeless tobacco. She reports current alcohol use. She reports that she does not use drugs.  Past Medical History:  Diagnosis Date   Arthritis    Headache    History of abnormal cervical Pap smear 2008   LGSIL   Pneumonia 2022   Shingles 2022    Past Surgical History:  Procedure Laterality Date   ABDOMINOPLASTY  03/2021   BILATERAL SALPINGECTOMY Bilateral 05/16/2014   Procedure: BILATERAL SALPINGECTOMY;  Surgeon: Lyman Speller, MD;  Location: [facility];  Service: Gynecology;  Laterality: Bilateral;   CHOLECYSTECTOMY     CYSTOSCOPY N/A 05/16/2014   Procedure: CYSTOSCOPY;  Surgeon: Lyman Speller, MD;  Location: [facility];  Service: Gynecology;  Laterality: N/A;   Ileocolonoscopy  10/19/2007   Normal terminal ileum approximately 10-20 cm visualized/ Normal colon / Large internal hemorrhoids/ nl colon. Melanosis coli   INTRAUTERINE DEVICE INSERTION  09/2009   Mirena   IUD REMOVAL N/A 05/16/2014   Procedure: INTRAUTERINE DEVICE (IUD) REMOVAL;  Surgeon: Lyman Speller, MD;  Location: [facility];  Service: Gynecology;  Laterality: N/A;   LAPAROSCOPIC HYSTERECTOMY N/A 05/16/2014   Procedure: HYSTERECTOMY TOTAL LAPAROSCOPIC;  Surgeon: Lyman Speller, MD;  Location:  [facility];  Service: Gynecology;  Laterality: N/A;   MASS EXCISION N/A 01/14/2022   Procedure: EXCISION MASS; 5cm, back;  Surgeon: Rusty Aus, DO;  Location: [facility];  Service: General;  Laterality: N/A;   TOTAL HIP ARTHROPLASTY Right 08/14/2020   Procedure: RIGHT TOTAL HIP ARTHROPLASTY ANTERIOR APPROACH;  Surgeon: Frederik Pear, MD;  Location: [facility];  Service: Orthopedics;  Laterality: Right;    Current Outpatient Medications  Medication Sig Dispense Refill   docusate sodium (COLACE) 100 MG capsule Take 1 capsule (100 mg total) by mouth 2 (two) times daily. 60 capsule 2   tirzepatide (MOUNJARO) 5 MG/0.5ML Pen Inject 5 mg into the skin every Wednesday. (Patient not taking: Reported on 08/07/2022)     No current facility-administered medications for this visit.    Family History  Problem Relation Age of Onset   Diabetes Mother    Hyperlipidemia Father    Lung cancer Maternal Grandmother    Cancer Paternal Grandmother 54       ovarian that was metastatic to colon   Colon cancer Paternal Grandmother    Thyroid disease Paternal Grandmother    Heart failure Paternal Grandfather    Lung cancer Maternal Aunt     ROS: Constitutional: negative Genitourinary:negative  Exam:   BP 129/83 (BP Location: Left Arm, Patient Position: Sitting, Cuff Size: Large)   Pulse 77   Ht '5\' 5"'$  (1.651 m) Comment: Reported  Wt 161 lb 3.2 oz (73.1  kg)   LMP 07/01/2009   BMI 26.83 kg/m   Height: '5\' 5"'$  (165.1 cm) (Reported)  General appearance: alert, cooperative and appears stated age Head: Normocephalic, without obvious abnormality, atraumatic Neck: no adenopathy, supple, symmetrical, trachea midline and thyroid normal to inspection and palpation Lungs: clear to auscultation bilaterally Breasts: normal appearance, no masses or tenderness Heart: regular rate and rhythm Abdomen: soft, non-tender; bowel sounds normal; no masses,  no organomegaly Extremities: extremities normal, atraumatic, no  cyanosis or edema Skin: Skin color, texture, turgor normal. No rashes or lesions Lymph nodes: Cervical, supraclavicular, and axillary nodes normal. No abnormal inguinal nodes palpated Neurologic: Grossly normal   Pelvic: External genitalia:  no lesions              Urethra:  normal appearing urethra with no masses, tenderness or lesions              Bartholins and Skenes: normal                 Vagina: normal appearing vagina with normal color and no discharge, no lesions              Cervix: absent              Pap taken: No. Bimanual Exam:  Uterus:  uterus absent              Adnexa: no mass, fullness, tenderness               Rectovaginal: Confirms               Anus:  normal sphincter tone, no lesions  Chaperone, Octaviano Batty, CMA, was present for exam.  Assessment/Plan: 1. Well woman exam with routine gynecological exam - Pap smear neg with neg HR HPV 2023.   - Mammogram scheduled today - Colonoscopy has been done in the past due to GI issues.  Cologuard ordered. - Bone mineral density not indicated - lab work done will be planned next year and will be done - vaccines reviewed/updated  2. Colon cancer screening - Cologuard  3. H/O: hysterectomy

## 2022-08-19 ENCOUNTER — Encounter: Payer: Self-pay | Admitting: *Deleted

## 2022-09-22 IMAGING — MG MM DIGITAL SCREENING BILAT W/ TOMO AND CAD
8 series · 8 of 24 positions shown · non-contrast
Comparison: None.

CLINICAL DATA: Screening.

EXAM:
DIGITAL SCREENING BILATERAL MAMMOGRAM WITH TOMOSYNTHESIS AND CAD
TECHNIQUE: Bilateral screening digital craniocaudal and mediolateral oblique
mammograms were obtained. Bilateral screening digital breast
tomosynthesis was performed. The images were evaluated with
computer-aided detection.

[R MLO synth-2D]
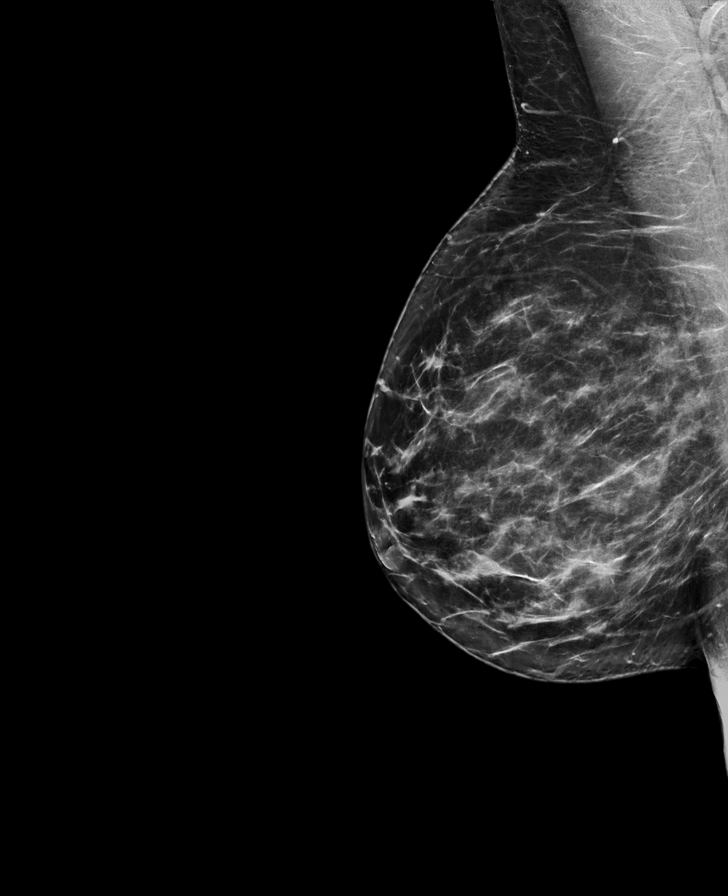

[L MLO synth-2D]
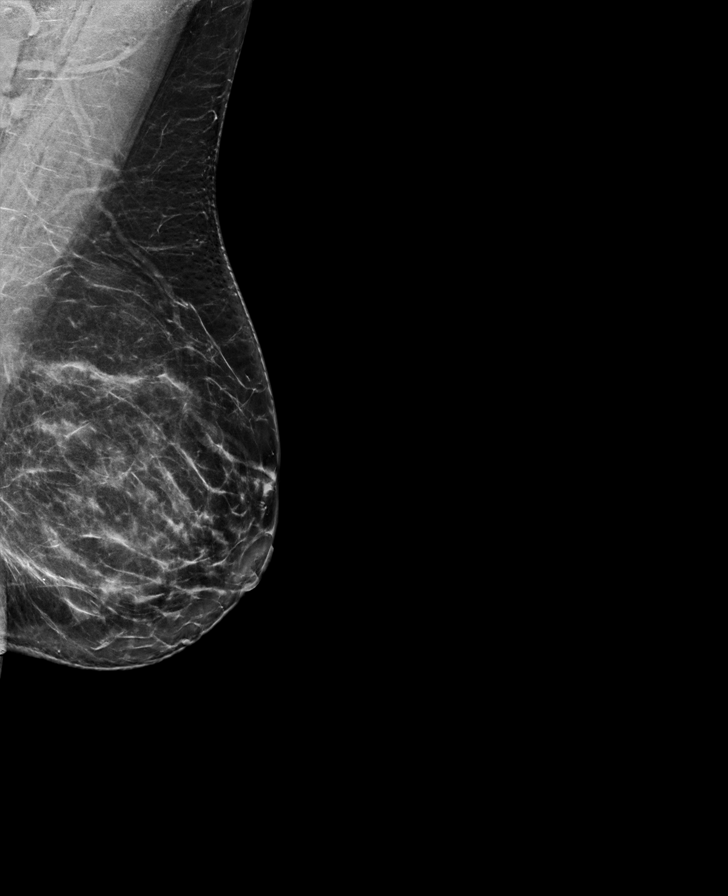

[L CC synth-2D]
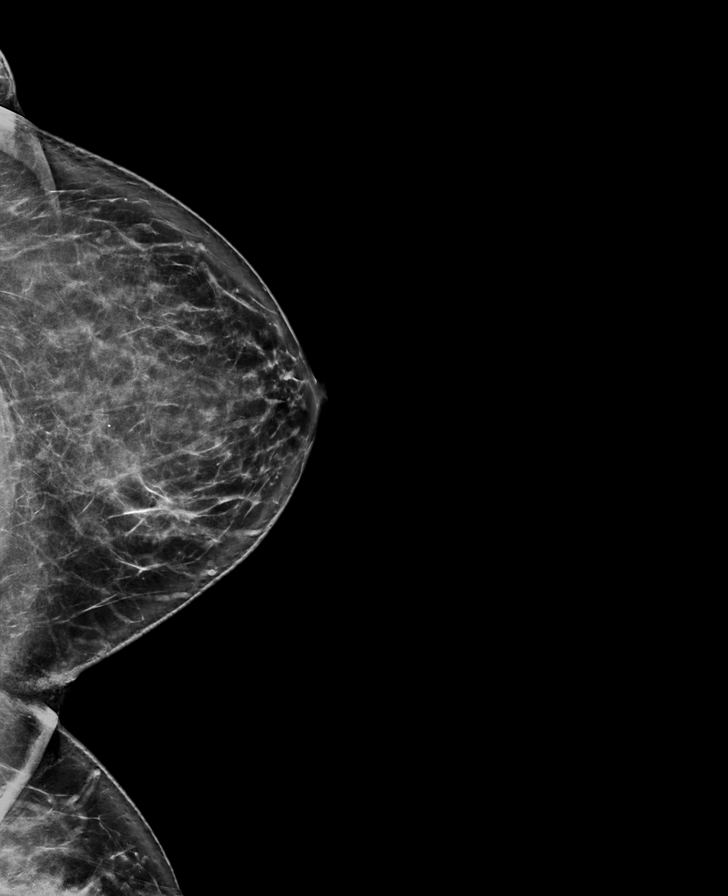

[R CC synth-2D]
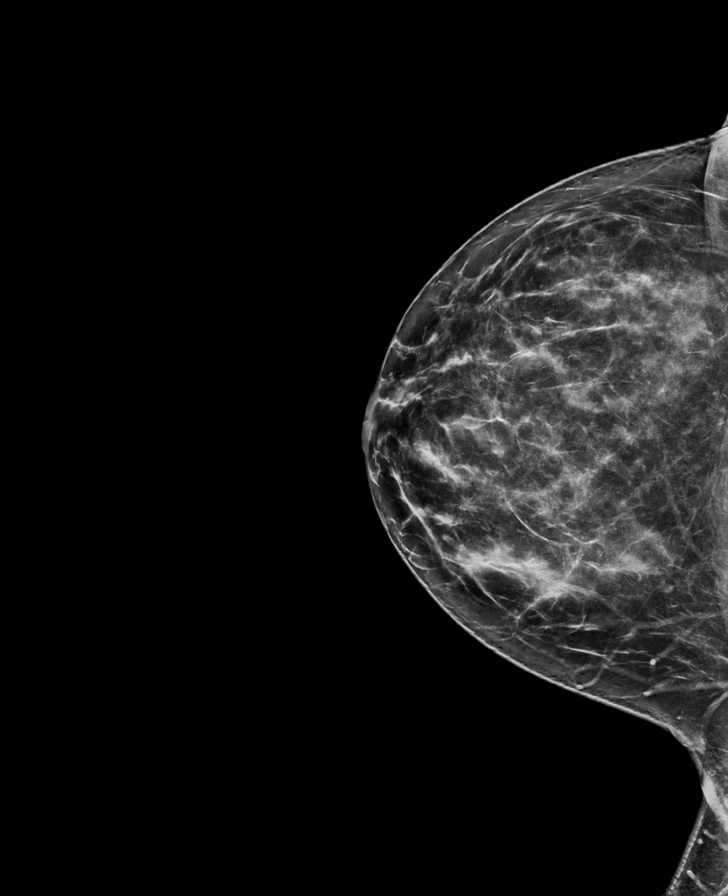

[R MLO tomo · tomo slice 44/87.0]
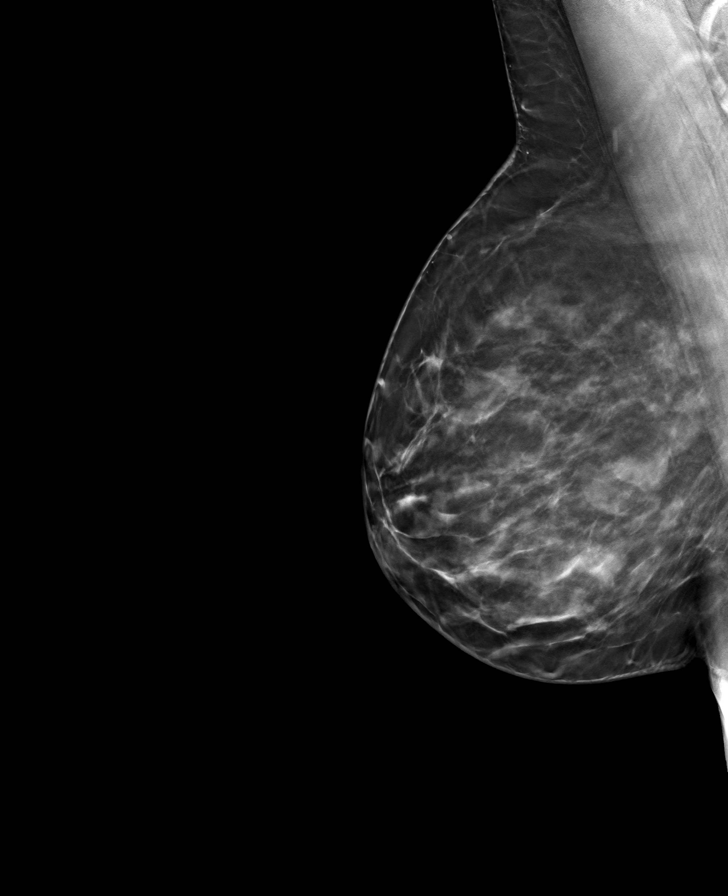

[L CC tomo · tomo slice 41/80.0]
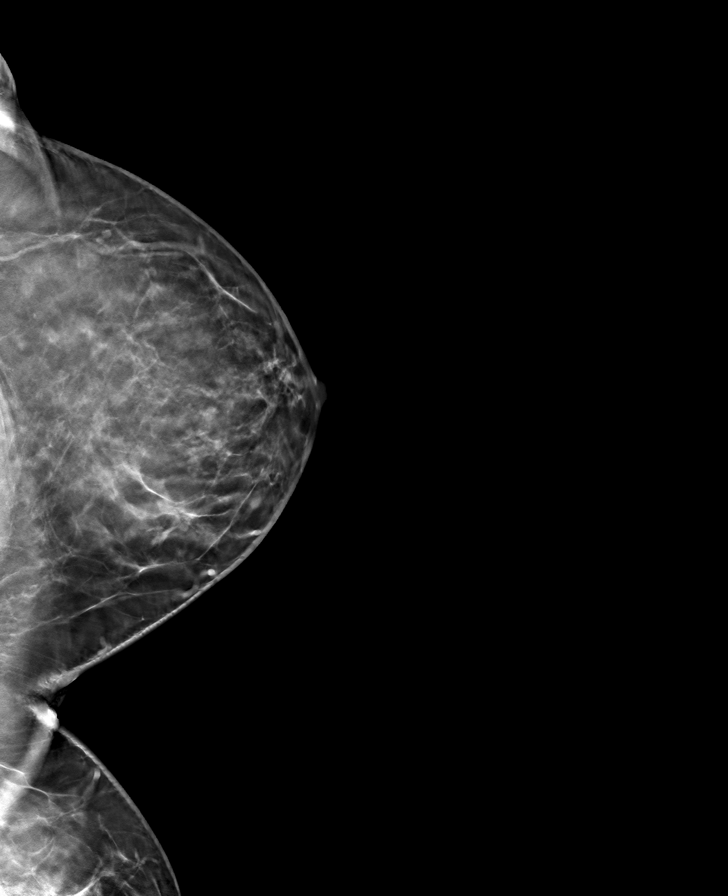

[R CC tomo · tomo slice 38/75.0]
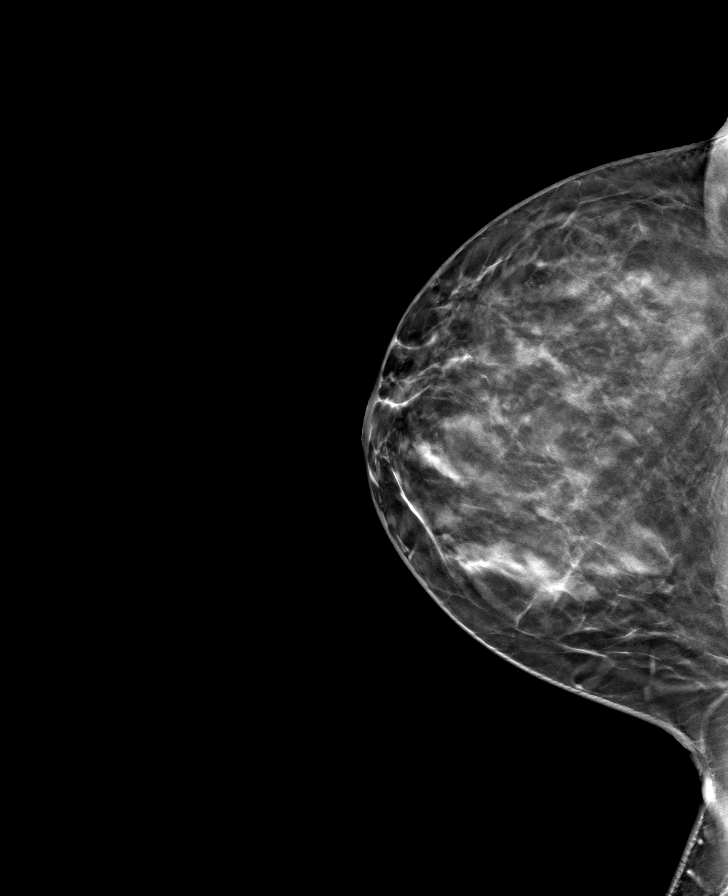

[L MLO tomo · tomo slice 41/80.0]
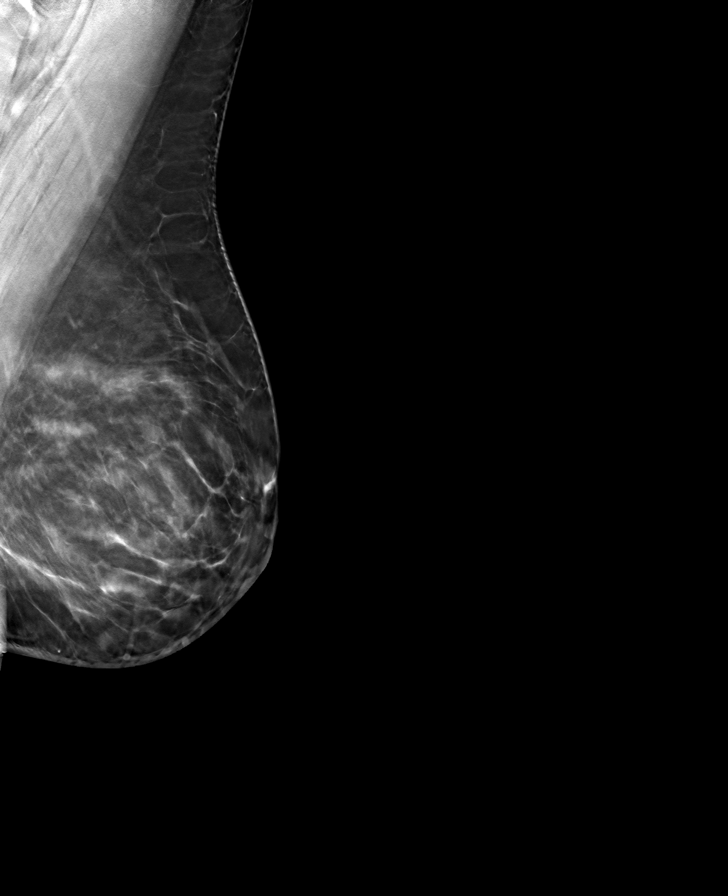

[8 of 24 positions shown; findings below may reference images not displayed]

ACR Breast Density Category c: The breast tissue is heterogeneously
dense, which may obscure small masses
FINDINGS: There are no findings suspicious for malignancy.
IMPRESSION: No mammographic evidence of malignancy. A result letter of this
screening mammogram will be mailed directly to the patient.

RECOMMENDATION:
Screening mammogram in one year. (Code:C8-T-HNK)

BI-RADS CATEGORY  1: Negative.

## 2023-03-16 ENCOUNTER — Emergency Department (HOSPITAL_COMMUNITY): Payer: Self-pay

## 2023-03-16 ENCOUNTER — Emergency Department (HOSPITAL_COMMUNITY)
Admission: EM | Admit: 2023-03-16 | Discharge: 2023-03-16 | Disposition: A | Discharge status: Home / Self Care | Payer: Self-pay | Attending: Emergency Medicine | Admitting: Emergency Medicine

## 2023-03-16 ENCOUNTER — Encounter (HOSPITAL_COMMUNITY): Payer: Self-pay | Admitting: *Deleted

## 2023-03-16 ENCOUNTER — Other Ambulatory Visit: Payer: Self-pay

## 2023-03-16 DIAGNOSIS — M542 Cervicalgia: Secondary | ICD-10-CM | POA: Insufficient documentation

## 2023-03-16 DIAGNOSIS — R0789 Other chest pain: Secondary | ICD-10-CM | POA: Insufficient documentation

## 2023-03-16 LAB — COMPREHENSIVE METABOLIC PANEL
ALT: 25 U/L (ref 0–44)
AST: 18 U/L (ref 15–41)
Albumin: 4 g/dL (ref 3.5–5.0)
Alkaline Phosphatase: 65 U/L (ref 38–126)
Anion gap: 11 (ref 5–15)
BUN: 12 mg/dL (ref 6–20)
CO2: 22 mmol/L (ref 22–32)
Calcium: 8.7 mg/dL — ABNORMAL LOW (ref 8.9–10.3)
Chloride: 101 mmol/L (ref 98–111)
Creatinine, Ser: 1 mg/dL (ref 0.44–1.00)
GFR, Estimated: >60 mL/min (ref >60)
Glucose, Bld: 90 mg/dL (ref 70–99)
Potassium: 3.7 mmol/L (ref 3.5–5.1)
Sodium: 134 mmol/L — ABNORMAL LOW (ref 135–145)
Total Bilirubin: 0.6 mg/dL (ref 0.3–1.2)
Total Protein: 6.8 g/dL (ref 6.5–8.1)

## 2023-03-16 LAB — CBC WITH DIFFERENTIAL/PLATELET
Abs Immature Granulocytes: 0.01 10*3/uL (ref 0.00–0.07)
Basophils Absolute: 0 10*3/uL (ref 0.0–0.1)
Basophils Relative: 1 %
Eosinophils Absolute: 0.2 10*3/uL (ref 0.0–0.5)
Eosinophils Relative: 3 %
HCT: 36.6 % (ref 36.0–46.0)
Hemoglobin: 12.8 g/dL (ref 12.0–15.0)
Immature Granulocytes: 0 %
Lymphocytes Relative: 30 %
Lymphs Abs: 2 10*3/uL (ref 0.7–4.0)
MCH: 29.9 pg (ref 26.0–34.0)
MCHC: 35 g/dL (ref 30.0–36.0)
MCV: 85.5 fL (ref 80.0–100.0)
Monocytes Absolute: 0.5 10*3/uL (ref 0.1–1.0)
Monocytes Relative: 8 %
Neutro Abs: 3.8 10*3/uL (ref 1.7–7.7)
Neutrophils Relative %: 58 %
Platelets: 247 10*3/uL (ref 150–400)
RBC: 4.28 MIL/uL (ref 3.87–5.11)
RDW: 11.6 % (ref 11.5–15.5)
WBC: 6.5 10*3/uL (ref 4.0–10.5)
nRBC: 0 % (ref 0.0–0.2)

## 2023-03-16 LAB — TROPONIN I (HIGH SENSITIVITY)
Troponin I (High Sensitivity): <2 ng/L (ref <18)
Troponin I (High Sensitivity): <2 ng/L (ref <18)

## 2023-03-16 LAB — HCG, QUANTITATIVE, PREGNANCY: hCG, Beta Chain, Quant, S: 1 m[IU]/mL (ref <5)

## 2023-03-16 MED ORDER — IOHEXOL 350 MG/ML SOLN
150.0000 mL | Freq: Once | INTRAVENOUS | Status: AC | PRN
  Administered 2023-03-16: 150 mL via INTRAVENOUS

## 2023-03-16 MED ORDER — KETOROLAC TROMETHAMINE 15 MG/ML IJ SOLN
15.0000 mg | Freq: Once | INTRAMUSCULAR | Status: DC
  Filled 2023-03-16: qty 1

## 2023-03-16 MED ORDER — ALUM & MAG HYDROXIDE-SIMETH 200-200-20 MG/5ML PO SUSP: 15.0000 mL | Freq: Once | ORAL | Status: DC

## 2023-03-16 NOTE — ED Notes (Signed)
Pt refused Nasal Swab

## 2023-03-16 NOTE — ED Notes (Signed)
CT post ultrasound line

## 2023-03-16 NOTE — ED Notes (Signed)
Pt refuses nasal swab. Pt states when this NT went in her room that this was the third time she was refusing the nasal swab.

## 2023-03-16 NOTE — Discharge Instructions (Signed)
You are seen in the emergency department today for chest pain and neck pain.  Your labs and imaging were thankfully reassuring with no signs of any cardiac abnormality.  There was no evidence of any blood clot in the lungs, neck or signs of any stenosis or dissection of any arteries either.  I am unsure exactly why you are having symptoms of chest pain with radiation to the right side of her neck.  I would try to manage symptoms at home as best she can with any over-the-counter anti-inflammatory medications.  I would still suggest keeping your appointment with cardiology for your stress test.  If you have any worsening or symptoms or new symptoms arise, please return to the emergency department for repeat evaluation.

## 2023-03-16 NOTE — ED Notes (Signed)
Pt waiting for Ultrasound line for CT

## 2023-03-16 NOTE — ED Triage Notes (Signed)
Pt with mid CP since Wednesday, pt has seen PCP for it. Pt began to have right jaw and neck pain off and on since Wednesday.

## 2023-03-16 NOTE — ED Notes (Signed)
Discharge instructions reviewed with pt/ support person. Opportunity for questions and concerns provided.   Ambulatory on departure. No signs of distress noted.

## 2023-03-16 NOTE — ED Provider Notes (Signed)
[place] EMERGENCY DEPARTMENT AT [facility] Provider Note   CSN: [id number] Arrival date & time: 03/16/23  1503     History Chief Complaint  Patient presents with   Chest Pain    Amy Lee is a 47 y.o. female.  Patient without significant past medical history presents to the emergency department concerns of chest pain.  Reports that she been experiencing chest pain approximately 5 days ago and states that it was centralized in nature with radiation towards the right side neck.  Denies any history of any cardiac abnormalities but does report that recently she has been having elevated blood pressure readings and was started on amlodipine.  Also Dors is some shortness of breath with new onset of chest pain.  Not currently anticoagulated but does start taking aspirin after being seen by her primary care provider few days ago.  Currently planning on having a stress test performed on Monday.  Denies any nausea, vomiting, diaphoresis during any of these episodes.  No coughing, fevers, congestion, abdominal pain, or urinary symptoms.   Chest Pain      Home Medications Prior to Admission medications   Medication Sig Start Date End Date Taking? Authorizing Provider  tirzepatide Greggory Keen) 5 MG/0.5ML Pen Inject 5 mg into the skin every Wednesday. Patient not taking: Reported on 08/07/2022    [provider]      Allergies    Ceclor [cefaclor], Fioricet-codeine [butalbital-apap-caff-cod], and Stadol [butorphanol]    Review of Systems   Review of Systems  Cardiovascular:  Positive for chest pain.  All other systems reviewed and are negative.   Physical Exam Updated Vital Signs BP 119/82   Pulse 80   Temp 98.2 F (36.8 C) (Oral)   Resp (!) 22   Ht 5\' 5"  (1.651 m)   Wt 70.3 kg   LMP 07/01/2009   SpO2 96%   BMI 25.79 kg/m  Physical Exam Vitals and nursing note reviewed.  Constitutional:      General: She is not in acute distress.    Appearance: She is  well-developed.  HENT:     Head: Normocephalic and atraumatic.  Eyes:     Conjunctiva/sclera: Conjunctivae normal.  Cardiovascular:     Rate and Rhythm: Normal rate and regular rhythm.     Heart sounds: No murmur heard. Pulmonary:     Effort: Pulmonary effort is normal. No respiratory distress.     Breath sounds: Normal breath sounds. No decreased breath sounds, wheezing or rhonchi.  Abdominal:     Palpations: Abdomen is soft.     Tenderness: There is no abdominal tenderness.  Musculoskeletal:        General: No swelling.     Cervical back: Neck supple.  Skin:    General: Skin is warm and dry.     Capillary Refill: Capillary refill takes less than 2 seconds.  Neurological:     Mental Status: She is alert.  Psychiatric:        Mood and Affect: Mood normal.     ED Results / Procedures / Treatments   Labs (all labs ordered are listed, but only abnormal results are displayed) Labs Reviewed  COMPREHENSIVE METABOLIC PANEL - Abnormal; Notable for the following components:      Result Value   Sodium 134 (*)    Calcium 8.7 (*)    All other components within normal limits  RESP PANEL BY RT-PCR (RSV, FLU A&B, COVID)  [id number]  CBC WITH DIFFERENTIAL/PLATELET  HCG, QUANTITATIVE, PREGNANCY  TROPONIN I (HIGH SENSITIVITY)  TROPONIN I (HIGH SENSITIVITY)    EKG EKG Interpretation Date/Time:  Sunday March 16 2023 15:11:15 EDT Ventricular Rate:  84 PR Interval:  128 QRS Duration:  99 QT Interval:  385 QTC Calculation: 456 R Axis:   72  Text Interpretation: Sinus rhythm Confirmed by Tanda Rockers ([id number]) on 03/16/2023 4:00:38 PM  Radiology No results found.  Procedures Procedures   Medications Ordered in ED Medications  iohexol (OMNIPAQUE) 350 MG/ML injection 150 mL (150 mLs Intravenous Contrast Given 03/16/23 1812)    ED Course/ Medical Decision Making/ A&P                               Medical Decision Making Amount and/or Complexity of Data Reviewed Labs:  ordered. Radiology: ordered.  Risk Prescription drug management.   This patient presents to the ED for concern of chest pain.  Differential diagnosis includes myocardial infarction, pulmonary embolism, pneumonia, bronchitis, viral URI, stress   Lab Tests:  I Ordered, and personally interpreted labs.  The pertinent results include: CBC is unremarkable, CMP with mild hyponatremia at 134 but otherwise unremarkable, troponin less than 2 with delta troponin also less than 2.  Quantitative hCG 1, patient refused respiratory viral panel   Imaging Studies ordered:  I ordered imaging studies including chest x-ray, CT angio chest, CT angio neck I independently visualized and interpreted imaging which showed no acute cardiopulmonary process seen on x-ray, no evidence of PE or pneumonia, no evidence of any stenosis, dissection, or other abnormality in the vasculature in the neck I agree with the radiologist interpretation   Problem List / ED Course:  Patient presents to the emergency department concerns of chest pain.  She reportedly experienced chest pain while at rest.  Was seen by primary care provider who advised patient to come to the emergency department given persistent symptoms.  Patient reports that the pain is been ongoing for approximately 5 days without significant improvement.  Denies any nausea, vomiting, associated diaphoresis.  No prior history of any cardiac abnormalities.  Requesting evaluation for this chest pain as she is unsure what is causing it with no clear cardiac history concern for possible causes.  Cardiac labs ordered for evaluation as well as basic labs including CBC, CMP, troponin.  Respiratory viral panel also ordered given recurrent symptoms.  EKG and chest x-ray also ordered for evaluation. Patient's initial workup is reassuring without any acute abnormality seen on EKG suggestive of STEMI, chest x-ray showing no signs of pneumonia or bronchitis, and troponin being  flat.  Basic labs are also unremarkable.  Informed patient of reassuring findings with patient is adamant that she feels that something is wrong and requesting further imaging.  Given area of pain with radiation into the neck, will order CT angio scans of the chest and neck for evaluation of vasculature as well for rule out of PE. CT chest negative for any acute findings suggested a pulm embolism or other abnormality such as pneumonia.  CT angio of the neck also negative for any findings suggesting occlusion, dissection, or other concerns. Informed patient of reassuring findings on CT imaging.  Given that everything has been negative today advise following up with primary care provider and cardiologist for anticipated stress test in 1 day.  Discussed strict return precautions with patient.  All questions answered prior to patient discharge.  Patient discharged home in stable condition.  Final Clinical Impression(s) / ED Diagnoses  Final diagnoses:  Atypical chest pain  Neck pain on right side    Rx / DC Orders ED Discharge Orders     None         Smitty Knudsen, PA-C 03/19/23 1534    Sloan Leiter, DO 03/22/23 [phone]

## 2023-03-16 NOTE — ED Notes (Signed)
Pt confirmed hx of hysterectomy

## 2023-08-25 ENCOUNTER — Ambulatory Visit (HOSPITAL_BASED_OUTPATIENT_CLINIC_OR_DEPARTMENT_OTHER): Payer: Self-pay | Admitting: Obstetrics & Gynecology

## 2023-09-24 ENCOUNTER — Ambulatory Visit (HOSPITAL_BASED_OUTPATIENT_CLINIC_OR_DEPARTMENT_OTHER): Payer: Self-pay | Admitting: Obstetrics & Gynecology

## 2023-10-06 ENCOUNTER — Ambulatory Visit (HOSPITAL_BASED_OUTPATIENT_CLINIC_OR_DEPARTMENT_OTHER): Payer: Self-pay | Admitting: Obstetrics & Gynecology

## 2023-12-31 ENCOUNTER — Encounter (HOSPITAL_BASED_OUTPATIENT_CLINIC_OR_DEPARTMENT_OTHER): Payer: Self-pay | Admitting: Obstetrics & Gynecology

## 2024-01-05 ENCOUNTER — Ambulatory Visit (HOSPITAL_BASED_OUTPATIENT_CLINIC_OR_DEPARTMENT_OTHER): Payer: Self-pay | Admitting: Obstetrics & Gynecology
